# Patient Record
Sex: Female | Born: 1960 | Race: White | Hispanic: No | Marital: Married | Smoking: Never smoker
Health system: Southern US, Community
[De-identification: ages and names within clinical notes are randomized; demographics above are authoritative.]

## PROBLEM LIST (undated history)

## (undated) DIAGNOSIS — K589 Irritable bowel syndrome without diarrhea: Secondary | ICD-10-CM

## (undated) DIAGNOSIS — M069 Rheumatoid arthritis, unspecified: Secondary | ICD-10-CM

## (undated) DIAGNOSIS — R079 Chest pain, unspecified: Secondary | ICD-10-CM

## (undated) DIAGNOSIS — R600 Localized edema: Secondary | ICD-10-CM

## (undated) DIAGNOSIS — E669 Obesity, unspecified: Secondary | ICD-10-CM

## (undated) DIAGNOSIS — M549 Dorsalgia, unspecified: Secondary | ICD-10-CM

## (undated) DIAGNOSIS — N393 Stress incontinence (female) (male): Secondary | ICD-10-CM

## (undated) DIAGNOSIS — E785 Hyperlipidemia, unspecified: Secondary | ICD-10-CM

## (undated) DIAGNOSIS — K829 Disease of gallbladder, unspecified: Secondary | ICD-10-CM

## (undated) DIAGNOSIS — I10 Essential (primary) hypertension: Secondary | ICD-10-CM

## (undated) DIAGNOSIS — E559 Vitamin D deficiency, unspecified: Secondary | ICD-10-CM

## (undated) DIAGNOSIS — R0602 Shortness of breath: Secondary | ICD-10-CM

## (undated) DIAGNOSIS — K649 Unspecified hemorrhoids: Secondary | ICD-10-CM

## (undated) DIAGNOSIS — Z8489 Family history of other specified conditions: Secondary | ICD-10-CM

## (undated) DIAGNOSIS — K219 Gastro-esophageal reflux disease without esophagitis: Secondary | ICD-10-CM

## (undated) DIAGNOSIS — F419 Anxiety disorder, unspecified: Secondary | ICD-10-CM

## (undated) DIAGNOSIS — F329 Major depressive disorder, single episode, unspecified: Secondary | ICD-10-CM

## (undated) DIAGNOSIS — N200 Calculus of kidney: Secondary | ICD-10-CM

## (undated) DIAGNOSIS — F32A Depression, unspecified: Secondary | ICD-10-CM

## (undated) DIAGNOSIS — M2550 Pain in unspecified joint: Secondary | ICD-10-CM

## (undated) HISTORY — PX: CHOLECYSTECTOMY OPEN: SUR202

## (undated) HISTORY — DX: Obesity, unspecified: E66.9

## (undated) HISTORY — DX: Depression, unspecified: F32.A

## (undated) HISTORY — DX: Hyperlipidemia, unspecified: E78.5

## (undated) HISTORY — DX: Irritable bowel syndrome, unspecified: K58.9

## (undated) HISTORY — DX: Chest pain, unspecified: R07.9

## (undated) HISTORY — DX: Localized edema: R60.0

## (undated) HISTORY — DX: Dorsalgia, unspecified: M54.9

## (undated) HISTORY — DX: Vitamin D deficiency, unspecified: E55.9

## (undated) HISTORY — DX: Stress incontinence (female) (male): N39.3

## (undated) HISTORY — DX: Gastro-esophageal reflux disease without esophagitis: K21.9

## (undated) HISTORY — DX: Disease of gallbladder, unspecified: K82.9

## (undated) HISTORY — DX: Pain in unspecified joint: M25.50

## (undated) HISTORY — DX: Shortness of breath: R06.02

## (undated) HISTORY — DX: Unspecified hemorrhoids: K64.9

---
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
## 1998-10-31 ENCOUNTER — Encounter: Payer: Self-pay | Admitting: Emergency Medicine

## 1998-10-31 ENCOUNTER — Emergency Department (HOSPITAL_COMMUNITY): Admission: EM | Admit: 1998-10-31 | Discharge: 1998-10-31 | Payer: Self-pay | Admitting: Emergency Medicine

## 1998-12-21 ENCOUNTER — Encounter: Payer: Self-pay | Admitting: Obstetrics and Gynecology

## 1998-12-21 ENCOUNTER — Ambulatory Visit (HOSPITAL_COMMUNITY): Admission: RE | Admit: 1998-12-21 | Discharge: 1998-12-21 | Payer: Self-pay | Admitting: Obstetrics and Gynecology

## 2000-01-30 ENCOUNTER — Ambulatory Visit (HOSPITAL_COMMUNITY): Admission: RE | Admit: 2000-01-30 | Discharge: 2000-01-30 | Payer: Self-pay | Admitting: Obstetrics and Gynecology

## 2000-01-30 ENCOUNTER — Encounter: Payer: Self-pay | Admitting: Obstetrics and Gynecology

## 2001-01-22 ENCOUNTER — Other Ambulatory Visit: Admission: RE | Admit: 2001-01-22 | Discharge: 2001-01-22 | Payer: Self-pay | Admitting: Obstetrics and Gynecology

## 2001-02-04 ENCOUNTER — Encounter: Payer: Self-pay | Admitting: Obstetrics and Gynecology

## 2001-02-04 ENCOUNTER — Ambulatory Visit (HOSPITAL_COMMUNITY): Admission: RE | Admit: 2001-02-04 | Discharge: 2001-02-04 | Payer: Self-pay | Admitting: Obstetrics and Gynecology

## 2001-02-10 ENCOUNTER — Encounter: Admission: RE | Admit: 2001-02-10 | Discharge: 2001-02-10 | Payer: Self-pay | Admitting: Internal Medicine

## 2001-02-10 ENCOUNTER — Encounter: Payer: Self-pay | Admitting: Internal Medicine

## 2001-07-29 ENCOUNTER — Encounter: Payer: Self-pay | Admitting: Obstetrics and Gynecology

## 2001-07-29 ENCOUNTER — Encounter: Admission: RE | Admit: 2001-07-29 | Discharge: 2001-07-29 | Payer: Self-pay | Admitting: Obstetrics and Gynecology

## 2002-04-06 ENCOUNTER — Other Ambulatory Visit: Admission: RE | Admit: 2002-04-06 | Discharge: 2002-04-06 | Payer: Self-pay | Admitting: Obstetrics and Gynecology

## 2003-05-01 ENCOUNTER — Other Ambulatory Visit: Admission: RE | Admit: 2003-05-01 | Discharge: 2003-05-01 | Payer: Self-pay | Admitting: Obstetrics and Gynecology

## 2003-05-10 ENCOUNTER — Ambulatory Visit (HOSPITAL_COMMUNITY): Admission: RE | Admit: 2003-05-10 | Discharge: 2003-05-10 | Payer: Self-pay | Admitting: Obstetrics and Gynecology

## 2004-10-25 ENCOUNTER — Encounter: Admission: RE | Admit: 2004-10-25 | Discharge: 2004-10-25 | Payer: Self-pay | Admitting: Rheumatology

## 2004-11-05 ENCOUNTER — Ambulatory Visit (HOSPITAL_COMMUNITY): Admission: RE | Admit: 2004-11-05 | Discharge: 2004-11-05 | Payer: Self-pay | Admitting: Obstetrics and Gynecology

## 2005-01-28 ENCOUNTER — Other Ambulatory Visit: Admission: RE | Admit: 2005-01-28 | Discharge: 2005-01-28 | Payer: Self-pay | Admitting: Obstetrics and Gynecology

## 2005-12-02 ENCOUNTER — Ambulatory Visit (HOSPITAL_COMMUNITY): Admission: RE | Admit: 2005-12-02 | Discharge: 2005-12-02 | Payer: Self-pay | Admitting: Obstetrics and Gynecology

## 2006-02-05 ENCOUNTER — Other Ambulatory Visit: Admission: RE | Admit: 2006-02-05 | Discharge: 2006-02-05 | Payer: Self-pay | Admitting: Obstetrics and Gynecology

## 2006-12-23 ENCOUNTER — Ambulatory Visit (HOSPITAL_COMMUNITY): Admission: RE | Admit: 2006-12-23 | Discharge: 2006-12-23 | Payer: Self-pay | Admitting: Obstetrics and Gynecology

## 2008-01-06 ENCOUNTER — Ambulatory Visit (HOSPITAL_COMMUNITY): Admission: RE | Admit: 2008-01-06 | Discharge: 2008-01-06 | Payer: Self-pay | Admitting: Obstetrics and Gynecology

## 2009-01-23 ENCOUNTER — Ambulatory Visit (HOSPITAL_COMMUNITY): Admission: RE | Admit: 2009-01-23 | Discharge: 2009-01-23 | Payer: Self-pay | Admitting: Obstetrics and Gynecology

## 2009-10-30 ENCOUNTER — Encounter: Admission: RE | Admit: 2009-10-30 | Discharge: 2009-10-30 | Payer: Self-pay | Admitting: Internal Medicine

## 2010-01-24 ENCOUNTER — Ambulatory Visit (HOSPITAL_COMMUNITY): Admission: RE | Admit: 2010-01-24 | Discharge: 2010-01-24 | Payer: Self-pay | Admitting: Obstetrics and Gynecology

## 2010-08-01 ENCOUNTER — Other Ambulatory Visit: Payer: Self-pay | Admitting: Internal Medicine

## 2010-08-01 DIAGNOSIS — R635 Abnormal weight gain: Secondary | ICD-10-CM

## 2010-08-01 DIAGNOSIS — R221 Localized swelling, mass and lump, neck: Secondary | ICD-10-CM

## 2010-08-05 ENCOUNTER — Ambulatory Visit
Admission: RE | Admit: 2010-08-05 | Discharge: 2010-08-05 | Disposition: A | Discharge status: Home / Self Care | Payer: Managed Care, Other (non HMO) | Source: Ambulatory Visit | Attending: Internal Medicine | Admitting: Internal Medicine

## 2010-08-05 DIAGNOSIS — R221 Localized swelling, mass and lump, neck: Secondary | ICD-10-CM

## 2010-08-05 DIAGNOSIS — R635 Abnormal weight gain: Secondary | ICD-10-CM

## 2011-02-06 ENCOUNTER — Other Ambulatory Visit (HOSPITAL_COMMUNITY): Payer: Self-pay | Admitting: Obstetrics and Gynecology

## 2011-02-06 DIAGNOSIS — Z1231 Encounter for screening mammogram for malignant neoplasm of breast: Secondary | ICD-10-CM

## 2011-02-14 ENCOUNTER — Other Ambulatory Visit: Payer: Self-pay | Admitting: Internal Medicine

## 2011-02-14 ENCOUNTER — Ambulatory Visit
Admission: RE | Admit: 2011-02-14 | Discharge: 2011-02-14 | Disposition: A | Discharge status: Home / Self Care | Payer: Managed Care, Other (non HMO) | Source: Ambulatory Visit | Attending: Internal Medicine | Admitting: Internal Medicine

## 2011-02-14 DIAGNOSIS — E041 Nontoxic single thyroid nodule: Secondary | ICD-10-CM

## 2011-02-20 ENCOUNTER — Ambulatory Visit (HOSPITAL_COMMUNITY)
Admission: RE | Admit: 2011-02-20 | Discharge: 2011-02-20 | Disposition: A | Discharge status: Home / Self Care | Payer: Managed Care, Other (non HMO) | Source: Ambulatory Visit | Attending: Obstetrics and Gynecology | Admitting: Obstetrics and Gynecology

## 2011-02-20 DIAGNOSIS — Z1231 Encounter for screening mammogram for malignant neoplasm of breast: Secondary | ICD-10-CM

## 2011-04-29 ENCOUNTER — Encounter (INDEPENDENT_AMBULATORY_CARE_PROVIDER_SITE_OTHER): Payer: Self-pay | Admitting: Surgery

## 2011-05-14 ENCOUNTER — Encounter (INDEPENDENT_AMBULATORY_CARE_PROVIDER_SITE_OTHER): Payer: Self-pay | Admitting: Surgery

## 2011-05-15 ENCOUNTER — Ambulatory Visit (INDEPENDENT_AMBULATORY_CARE_PROVIDER_SITE_OTHER): Payer: Self-pay | Admitting: Surgery

## 2011-06-04 ENCOUNTER — Encounter (INDEPENDENT_AMBULATORY_CARE_PROVIDER_SITE_OTHER): Payer: Self-pay | Admitting: Surgery

## 2011-06-04 ENCOUNTER — Ambulatory Visit (INDEPENDENT_AMBULATORY_CARE_PROVIDER_SITE_OTHER): Payer: Managed Care, Other (non HMO) | Admitting: Surgery

## 2011-06-04 VITALS — BP 158/100 | HR 60 | Temp 97.8°F | Resp 18 | Ht 63.0 in | Wt 215.0 lb

## 2011-06-04 DIAGNOSIS — K644 Residual hemorrhoidal skin tags: Secondary | ICD-10-CM | POA: Insufficient documentation

## 2011-06-04 DIAGNOSIS — K648 Other hemorrhoids: Secondary | ICD-10-CM

## 2011-06-04 NOTE — Progress Notes (Signed)
Patient ID: Sandra Henderson, female   DOB: [birth year], 51 y.o.   MRN: [id number]  Chief Complaint  Patient presents with  . Hemorrhoids    HPI Sandra Henderson is a 51 y.o. female.  Referred by Dr. Stefano Gaul for evaluation of hemorrhoids. HPI 51 yo female who developed hemorrhoids during her pregnancy 20 years ago presents with intermittent episodes of painful hemorrhoids and persistent discomfort/ burning.  She has regular daily bowel movements with no constipation.  Colonoscopy last year showed internal hemorrhoids and two small polyps.  She has been using over the counter creams and wet wipes, but continues to have persistent symptoms.  She is now referred to discuss possible surgical intervention. Past Medical History  Diagnosis Date  . Hemorrhoid   . Obesity   . SUI (stress urinary incontinence, female)   . Arthritis   . GERD (gastroesophageal reflux disease)   . Hyperlipidemia     Past Surgical History  Procedure Date  . Cholecystectomy     No family history on file.  Social History History  Substance Use Topics  . Smoking status: Never Smoker   . Smokeless tobacco: Not on file  . Alcohol Use: 4.5 oz/week    9 drink(s) per week    No Known Allergies  Current Outpatient Prescriptions  Medication Sig Dispense Refill  . ALPRAZolam (XANAX) 1 MG tablet Take 2.5 mg by mouth as needed.      Marland Kitchen amLODipine-olmesartan (AZOR) 5-20 MG per tablet Take 1 tablet by mouth daily.      . BuPROPion HCl (WELLBUTRIN PO) Take 100 mg by mouth daily.       . metroNIDAZOLE (METROGEL) 0.75 % gel Apply 1 application topically daily.      Marland Kitchen nystatin cream (MYCOSTATIN) Apply 1 application topically daily.      . Omeprazole (PRILOSEC PO) Take by mouth daily.       . simvastatin (ZOCOR) 20 MG tablet Take 20 mg by mouth daily.        Review of Systems Review of Systems  Constitutional: Negative for fever, chills and unexpected weight change.  HENT: Negative for hearing loss, congestion, sore  throat, trouble swallowing and voice change.   Eyes: Negative for visual disturbance.  Respiratory: Negative for cough and wheezing.   Cardiovascular: Negative for chest pain, palpitations and leg swelling.  Gastrointestinal: Positive for blood in stool and anal bleeding. Negative for nausea, vomiting, abdominal pain, diarrhea, constipation and abdominal distention.  Genitourinary: Negative for hematuria, vaginal bleeding and difficulty urinating.  Musculoskeletal: Positive for arthralgias.  Skin: Negative for rash and wound.  Neurological: Negative for seizures, syncope and headaches.  Hematological: Negative for adenopathy. Does not bruise/bleed easily.  Psychiatric/Behavioral: Negative for confusion.    Blood pressure 158/100, pulse 60, temperature 97.8 F (36.6 C), temperature source Temporal, resp. rate 18, height 5\' 3"  (1.6 m), weight 215 lb (97.523 kg).  Physical Exam Physical Exam WDWN in NAD Rectal - circumferential loose external hemorrhoids with two areas of prolapsing internal mucosa.  No abscess, fissure, or fistula noted.  Moderately enlarged internal hemorrhoids on DRE. Data Reviewed None  Assessment    Enlarged external hemorrhoids with prolapsing internal hemorrhoids    Plan    The patient has obviously failed conservative therapy over the last 20 years.  Recommend hemorrhoidectomy under anesthesia.  The surgical procedure has been discussed with the patient.  Potential risks, benefits, alternative treatments, and expected outcomes have been explained.  All of the patient's questions at this time  have been answered.  The likelihood of reaching the patient's treatment goal is good.  The patient understand the proposed surgical procedure and wishes to proceed.        Azure Barrales K. 06/04/2011, 10:18 AM

## 2011-06-04 NOTE — Patient Instructions (Signed)
We will schedule your surgery today. 

## 2011-06-04 NOTE — Progress Notes (Signed)
Addended by: Wynona Luna on: 06/04/2011 10:26 AM   Modules accepted: Orders

## 2011-06-05 ENCOUNTER — Encounter (INDEPENDENT_AMBULATORY_CARE_PROVIDER_SITE_OTHER): Payer: Self-pay

## 2012-06-23 ENCOUNTER — Other Ambulatory Visit: Payer: Self-pay | Admitting: Obstetrics and Gynecology

## 2012-06-23 DIAGNOSIS — Z1231 Encounter for screening mammogram for malignant neoplasm of breast: Secondary | ICD-10-CM

## 2012-07-15 ENCOUNTER — Ambulatory Visit (HOSPITAL_COMMUNITY): Payer: Managed Care, Other (non HMO)

## 2012-07-27 ENCOUNTER — Ambulatory Visit (HOSPITAL_COMMUNITY)
Admission: RE | Admit: 2012-07-27 | Discharge: 2012-07-27 | Disposition: A | Discharge status: Home / Self Care | Payer: Managed Care, Other (non HMO) | Source: Ambulatory Visit | Attending: Obstetrics and Gynecology | Admitting: Obstetrics and Gynecology

## 2012-07-27 DIAGNOSIS — Z1231 Encounter for screening mammogram for malignant neoplasm of breast: Secondary | ICD-10-CM | POA: Insufficient documentation

## 2013-06-20 ENCOUNTER — Other Ambulatory Visit (HOSPITAL_COMMUNITY): Payer: Self-pay | Admitting: Internal Medicine

## 2013-06-20 DIAGNOSIS — Z1231 Encounter for screening mammogram for malignant neoplasm of breast: Secondary | ICD-10-CM

## 2013-08-02 ENCOUNTER — Ambulatory Visit (HOSPITAL_COMMUNITY)
Admission: RE | Admit: 2013-08-02 | Discharge: 2013-08-02 | Disposition: A | Discharge status: Home / Self Care | Payer: Managed Care, Other (non HMO) | Source: Ambulatory Visit | Attending: Internal Medicine | Admitting: Internal Medicine

## 2013-08-02 DIAGNOSIS — Z1231 Encounter for screening mammogram for malignant neoplasm of breast: Secondary | ICD-10-CM | POA: Insufficient documentation

## 2014-09-04 ENCOUNTER — Other Ambulatory Visit (HOSPITAL_COMMUNITY): Payer: Self-pay | Admitting: Internal Medicine

## 2014-09-04 DIAGNOSIS — Z1231 Encounter for screening mammogram for malignant neoplasm of breast: Secondary | ICD-10-CM

## 2014-09-14 ENCOUNTER — Ambulatory Visit (HOSPITAL_COMMUNITY): Payer: Managed Care, Other (non HMO)

## 2014-09-21 ENCOUNTER — Ambulatory Visit (HOSPITAL_COMMUNITY)
Admission: RE | Admit: 2014-09-21 | Discharge: 2014-09-21 | Disposition: A | Discharge status: Home / Self Care | Payer: BLUE CROSS/BLUE SHIELD | Source: Ambulatory Visit | Attending: Internal Medicine | Admitting: Internal Medicine

## 2014-09-21 DIAGNOSIS — Z1231 Encounter for screening mammogram for malignant neoplasm of breast: Secondary | ICD-10-CM | POA: Insufficient documentation

## 2014-09-26 ENCOUNTER — Emergency Department (HOSPITAL_COMMUNITY): Payer: BLUE CROSS/BLUE SHIELD

## 2014-09-26 ENCOUNTER — Encounter (HOSPITAL_COMMUNITY): Payer: Self-pay | Admitting: *Deleted

## 2014-09-26 ENCOUNTER — Observation Stay (HOSPITAL_COMMUNITY)
Admission: EM | Admit: 2014-09-26 | Discharge: 2014-09-27 | Disposition: A | Discharge status: Home / Self Care | Payer: BLUE CROSS/BLUE SHIELD | Attending: Internal Medicine | Admitting: Internal Medicine

## 2014-09-26 ENCOUNTER — Other Ambulatory Visit (HOSPITAL_BASED_OUTPATIENT_CLINIC_OR_DEPARTMENT_OTHER): Payer: Managed Care, Other (non HMO)

## 2014-09-26 DIAGNOSIS — M1990 Unspecified osteoarthritis, unspecified site: Secondary | ICD-10-CM | POA: Diagnosis not present

## 2014-09-26 DIAGNOSIS — R209 Unspecified disturbances of skin sensation: Secondary | ICD-10-CM | POA: Diagnosis present

## 2014-09-26 DIAGNOSIS — F1020 Alcohol dependence, uncomplicated: Secondary | ICD-10-CM

## 2014-09-26 DIAGNOSIS — M069 Rheumatoid arthritis, unspecified: Secondary | ICD-10-CM | POA: Diagnosis not present

## 2014-09-26 DIAGNOSIS — E785 Hyperlipidemia, unspecified: Secondary | ICD-10-CM | POA: Insufficient documentation

## 2014-09-26 DIAGNOSIS — I739 Peripheral vascular disease, unspecified: Secondary | ICD-10-CM | POA: Insufficient documentation

## 2014-09-26 DIAGNOSIS — Z6836 Body mass index (BMI) 36.0-36.9, adult: Secondary | ICD-10-CM | POA: Insufficient documentation

## 2014-09-26 DIAGNOSIS — F419 Anxiety disorder, unspecified: Secondary | ICD-10-CM | POA: Diagnosis not present

## 2014-09-26 DIAGNOSIS — Z9049 Acquired absence of other specified parts of digestive tract: Secondary | ICD-10-CM | POA: Diagnosis not present

## 2014-09-26 DIAGNOSIS — R208 Other disturbances of skin sensation: Secondary | ICD-10-CM | POA: Diagnosis not present

## 2014-09-26 DIAGNOSIS — E669 Obesity, unspecified: Secondary | ICD-10-CM | POA: Diagnosis not present

## 2014-09-26 DIAGNOSIS — K219 Gastro-esophageal reflux disease without esophagitis: Secondary | ICD-10-CM | POA: Diagnosis not present

## 2014-09-26 DIAGNOSIS — I10 Essential (primary) hypertension: Secondary | ICD-10-CM

## 2014-09-26 DIAGNOSIS — I639 Cerebral infarction, unspecified: Secondary | ICD-10-CM

## 2014-09-26 DIAGNOSIS — G459 Transient cerebral ischemic attack, unspecified: Secondary | ICD-10-CM

## 2014-09-26 DIAGNOSIS — F10239 Alcohol dependence with withdrawal, unspecified: Secondary | ICD-10-CM | POA: Insufficient documentation

## 2014-09-26 HISTORY — DX: Calculus of kidney: N20.0

## 2014-09-26 HISTORY — DX: Rheumatoid arthritis, unspecified: M06.9

## 2014-09-26 HISTORY — DX: Anxiety disorder, unspecified: F41.9

## 2014-09-26 HISTORY — DX: Family history of other specified conditions: Z84.89

## 2014-09-26 HISTORY — DX: Essential (primary) hypertension: I10

## 2014-09-26 LAB — CBC WITH DIFFERENTIAL/PLATELET
Basophils Absolute: 0.1 10*3/uL (ref 0.0–0.1)
Basophils Relative: 1 % (ref 0–1)
Eosinophils Absolute: 0.1 10*3/uL (ref 0.0–0.7)
Eosinophils Relative: 2 % (ref 0–5)
HCT: 40.7 % (ref 36.0–46.0)
Hemoglobin: 13.5 g/dL (ref 12.0–15.0)
Lymphocytes Relative: 34 % (ref 12–46)
Lymphs Abs: 2.8 10*3/uL (ref 0.7–4.0)
MCH: 28.7 pg (ref 26.0–34.0)
MCHC: 33.2 g/dL (ref 30.0–36.0)
MCV: 86.4 fL (ref 78.0–100.0)
Monocytes Absolute: 0.5 10*3/uL (ref 0.1–1.0)
Monocytes Relative: 6 % (ref 3–12)
Neutro Abs: 4.8 10*3/uL (ref 1.7–7.7)
Neutrophils Relative %: 57 % (ref 43–77)
Platelets: 443 10*3/uL — ABNORMAL HIGH (ref 150–400)
RBC: 4.71 MIL/uL (ref 3.87–5.11)
RDW: 12.9 % (ref 11.5–15.5)
WBC: 8.2 10*3/uL (ref 4.0–10.5)

## 2014-09-26 LAB — URINALYSIS, ROUTINE W REFLEX MICROSCOPIC
Bilirubin Urine: NEGATIVE
Glucose, UA: NEGATIVE mg/dL
Hgb urine dipstick: NEGATIVE
Ketones, ur: NEGATIVE mg/dL
Leukocytes, UA: NEGATIVE
Nitrite: NEGATIVE
Protein, ur: NEGATIVE mg/dL
Specific Gravity, Urine: 1.02 (ref 1.005–1.030)
Urobilinogen, UA: 0.2 mg/dL (ref 0.0–1.0)
pH: 6.5 (ref 5.0–8.0)

## 2014-09-26 LAB — I-STAT TROPONIN, ED: Troponin i, poc: 0 ng/mL (ref 0.00–0.08)

## 2014-09-26 LAB — COMPREHENSIVE METABOLIC PANEL
ALT: 30 U/L (ref 14–54)
AST: 26 U/L (ref 15–41)
Albumin: 4 g/dL (ref 3.5–5.0)
Alkaline Phosphatase: 62 U/L (ref 38–126)
Anion gap: 10 (ref 5–15)
BUN: 13 mg/dL (ref 6–20)
CO2: 23 mmol/L (ref 22–32)
Calcium: 9.6 mg/dL (ref 8.9–10.3)
Chloride: 105 mmol/L (ref 101–111)
Creatinine, Ser: 0.76 mg/dL (ref 0.44–1.00)
GFR calc Af Amer: >60 mL/min (ref >60)
GFR calc non Af Amer: >60 mL/min (ref >60)
Glucose, Bld: 114 mg/dL — ABNORMAL HIGH (ref 65–99)
Potassium: 3.9 mmol/L (ref 3.5–5.1)
Sodium: 138 mmol/L (ref 135–145)
Total Bilirubin: 0.7 mg/dL (ref 0.3–1.2)
Total Protein: 7.2 g/dL (ref 6.5–8.1)

## 2014-09-26 LAB — PROTIME-INR
INR: 1.04 (ref 0.00–1.49)
Prothrombin Time: 13.7 seconds (ref 11.6–15.2)

## 2014-09-26 LAB — RAPID URINE DRUG SCREEN, HOSP PERFORMED
Amphetamines: NOT DETECTED
Barbiturates: NOT DETECTED
Benzodiazepines: POSITIVE — AB
Cocaine: NOT DETECTED
Opiates: NOT DETECTED
Tetrahydrocannabinol: NOT DETECTED

## 2014-09-26 LAB — APTT: aPTT: 26 seconds (ref 24–37)

## 2014-09-26 MED ORDER — STROKE: EARLY STAGES OF RECOVERY BOOK: Freq: Once | Status: DC

## 2014-09-26 MED ORDER — DIPHENHYDRAMINE HCL 50 MG/ML IJ SOLN
25.0000 mg | Freq: Once | INTRAMUSCULAR | Status: AC
  Administered 2014-09-26: 25 mg via INTRAVENOUS
  Filled 2014-09-26: qty 1

## 2014-09-26 MED ORDER — IRBESARTAN 300 MG PO TABS
300.0000 mg | ORAL_TABLET | Freq: Every day | ORAL | Status: DC
  Administered 2014-09-27: 300 mg via ORAL
  Filled 2014-09-26 (×2): qty 1

## 2014-09-26 MED ORDER — ACETAMINOPHEN 650 MG RE SUPP: 650.0000 mg | RECTAL | Status: DC | PRN

## 2014-09-26 MED ORDER — LORAZEPAM 2 MG/ML IJ SOLN: 1.0000 mg | Freq: Four times a day (QID) | INTRAMUSCULAR | Status: DC | PRN

## 2014-09-26 MED ORDER — LORAZEPAM 2 MG/ML IJ SOLN
2.0000 mg | Freq: Once | INTRAMUSCULAR | Status: AC
  Administered 2014-09-27: 2 mg via INTRAVENOUS
  Filled 2014-09-26: qty 1

## 2014-09-26 MED ORDER — ONDANSETRON HCL 4 MG/2ML IJ SOLN: 4.0000 mg | Freq: Four times a day (QID) | INTRAMUSCULAR | Status: DC | PRN

## 2014-09-26 MED ORDER — LORAZEPAM 1 MG PO TABS: 1.0000 mg | ORAL_TABLET | Freq: Four times a day (QID) | ORAL | Status: DC | PRN

## 2014-09-26 MED ORDER — SODIUM CHLORIDE 0.9 % IV BOLUS (SEPSIS)
1000.0000 mL | INTRAVENOUS | Status: AC
  Administered 2014-09-26: 1000 mL via INTRAVENOUS

## 2014-09-26 MED ORDER — AMLODIPINE-OLMESARTAN 5-20 MG PO TABS: 1.0000 | ORAL_TABLET | Freq: Every day | ORAL | Status: DC

## 2014-09-26 MED ORDER — VITAMIN B-1 100 MG PO TABS
100.0000 mg | ORAL_TABLET | Freq: Every day | ORAL | Status: DC
  Administered 2014-09-26 – 2014-09-27 (×2): 100 mg via ORAL
  Filled 2014-09-26 (×2): qty 1

## 2014-09-26 MED ORDER — THIAMINE HCL 100 MG/ML IJ SOLN: 100.0000 mg | Freq: Every day | INTRAMUSCULAR | Status: DC

## 2014-09-26 MED ORDER — PANTOPRAZOLE SODIUM 40 MG PO TBEC: 40.0000 mg | DELAYED_RELEASE_TABLET | Freq: Every day | ORAL | Status: DC

## 2014-09-26 MED ORDER — PANTOPRAZOLE SODIUM 40 MG PO TBEC
40.0000 mg | DELAYED_RELEASE_TABLET | Freq: Every day | ORAL | Status: DC
  Administered 2014-09-27: 40 mg via ORAL
  Filled 2014-09-26: qty 1

## 2014-09-26 MED ORDER — POLYVINYL ALCOHOL 1.4 % OP SOLN
1.0000 [drp] | OPHTHALMIC | Status: DC | PRN
  Administered 2014-09-27: 1 [drp] via OPHTHALMIC
  Filled 2014-09-26: qty 15

## 2014-09-26 MED ORDER — FOLIC ACID 1 MG PO TABS
1.0000 mg | ORAL_TABLET | Freq: Every day | ORAL | Status: DC
  Administered 2014-09-26 – 2014-09-27 (×2): 1 mg via ORAL
  Filled 2014-09-26 (×2): qty 1

## 2014-09-26 MED ORDER — METOCLOPRAMIDE HCL 5 MG/ML IJ SOLN
10.0000 mg | Freq: Once | INTRAMUSCULAR | Status: AC
  Administered 2014-09-26: 10 mg via INTRAVENOUS
  Filled 2014-09-26: qty 2

## 2014-09-26 MED ORDER — ALPRAZOLAM 0.5 MG PO TABS: 0.5000 mg | ORAL_TABLET | Freq: Three times a day (TID) | ORAL | Status: DC | PRN

## 2014-09-26 MED ORDER — ADULT MULTIVITAMIN W/MINERALS CH
1.0000 | ORAL_TABLET | Freq: Every day | ORAL | Status: DC
  Administered 2014-09-26 – 2014-09-27 (×2): 1 via ORAL
  Filled 2014-09-26 (×2): qty 1

## 2014-09-26 MED ORDER — HYDROCHLOROTHIAZIDE 12.5 MG PO CAPS
12.5000 mg | ORAL_CAPSULE | Freq: Two times a day (BID) | ORAL | Status: DC
  Administered 2014-09-27: 12.5 mg via ORAL
  Filled 2014-09-26 (×2): qty 1

## 2014-09-26 MED ORDER — ACETAMINOPHEN 325 MG PO TABS: 650.0000 mg | ORAL_TABLET | ORAL | Status: DC | PRN

## 2014-09-26 MED ORDER — AMLODIPINE BESYLATE 5 MG PO TABS
5.0000 mg | ORAL_TABLET | Freq: Every day | ORAL | Status: DC
  Administered 2014-09-27: 5 mg via ORAL
  Filled 2014-09-26 (×2): qty 1

## 2014-09-26 MED ORDER — ENOXAPARIN SODIUM 40 MG/0.4ML ~~LOC~~ SOLN
40.0000 mg | SUBCUTANEOUS | Status: DC
  Administered 2014-09-26: 40 mg via SUBCUTANEOUS
  Filled 2014-09-26: qty 0.4

## 2014-09-26 MED ORDER — SENNOSIDES-DOCUSATE SODIUM 8.6-50 MG PO TABS: 1.0000 | ORAL_TABLET | Freq: Every evening | ORAL | Status: DC | PRN

## 2014-09-26 MED ORDER — SIMVASTATIN 20 MG PO TABS
20.0000 mg | ORAL_TABLET | Freq: Every day | ORAL | Status: DC
  Administered 2014-09-26: 20 mg via ORAL
  Filled 2014-09-26: qty 1

## 2014-09-26 NOTE — H&P (Signed)
History and Physical  Sandra Henderson YQM:[id number] DOB: 02/28/1961 DOA: 09/26/2014   PCP: Georgann Housekeeper, MD  Referring Physician: ED/ Dr. Romeo Apple  Chief Complaint: Sensory disturbance  HPI:  54 year old female with a history of GERD, hypertension, hyperlipidemia, anxiety presents with numbness and tingling in her left perioral area as well as her left upper extremity. She stated that her symptoms began on 09/25/2014 around noon time. Her symptoms initially had some improvement, but her symptoms lingered. When she woke up this morning she continued to have the perioral numbness as well as a feeling of her left upper extremity feeling "thick". As a result she came to the emergency department for further evaluation. She denied any dysarthria, visual disturbance, focal extremity weakness.  Patient denies fevers, chills, headache, chest pain, dyspnea, nausea, vomiting, diarrhea, abdominal pain, dysuria, hematuria She states that she has been under a lot of stress recently. Apparently she took her blood pressure at home and it was systolic 160-170 with diastolics 100-110. She states that she drinks 2 glasses of wine every other day but denies any other liquor or beer. She occasionally drinks a mixed drink with her wine.  In emergency department, the patient was afebrile and hemodynamically stable. BMP, hepatic enzymes, CBC were unremarkable. Urinalysis was negative for pyuria. CT of the brain showed a small hypodensity in the left external capsule area indeterminate for possible lacunar infarct. As result, admission was requested for stroke workup. Chest x-ray was negative. Assessment/Plan: Sensory disturbance with abnormal CT brain -MRI brain -Echocardiogram -Carotid duplex -Lipid panel -Hemoglobin A1c -PT/OT Hypertension -Continue amlodipine and ARB combination -Continue HCTZ Anxiety -Continue alprazolam when necessary She is no longer taking Wellbutrin- Alcohol dependence  -Alcohol  withdrawal protocol  GERD -Continue PPI Hyperlipidemia -Continue Zocor     Past Medical History  Diagnosis Date  . Hemorrhoid   . Obesity   . SUI (stress urinary incontinence, female)   . Arthritis   . GERD (gastroesophageal reflux disease)   . Hyperlipidemia    Past Surgical History  Procedure Laterality Date  . Cholecystectomy     Social History:  reports that she has never smoked. She does not have any smokeless tobacco history on file. She reports that she drinks about 4.5 oz of alcohol per week. She reports that she does not use illicit drugs.   No family history on file.   No Known Allergies    Prior to Admission medications   Medication Sig Start Date End Date Taking? Authorizing Provider  ALPRAZolam Prudy Feeler) 1 MG tablet Take 0.5 mg by mouth as needed for anxiety.    Yes Historical Provider, MD  amLODipine-olmesartan (AZOR) 5-20 MG per tablet Take 1 tablet by mouth daily.   Yes Historical Provider, MD  hydrochlorothiazide (MICROZIDE) 12.5 MG capsule Take 12.5 mg by mouth daily.   Yes Historical Provider, MD  metroNIDAZOLE (METROGEL) 0.75 % gel Apply 1 application topically daily.   Yes Historical Provider, MD  nystatin cream (MYCOSTATIN) Apply 1 application topically daily.   Yes Historical Provider, MD  omeprazole (PRILOSEC) 20 MG capsule Take 20 mg by mouth daily.   Yes Historical Provider, MD  BuPROPion HCl (WELLBUTRIN PO) Take 100 mg by mouth daily.     Historical Provider, MD  Omeprazole (PRILOSEC PO) Take by mouth daily.     Historical Provider, MD  simvastatin (ZOCOR) 20 MG tablet Take 20 mg by mouth daily.    Historical Provider, MD    Review of Systems:  Constitutional:  No weight loss, night sweats, Fevers, chills, fatigue.  Head&Eyes: No headache.  No vision loss.  No eye pain or scotoma ENT:  No Difficulty swallowing,Tooth/dental problems,Sore throat,  No ear ache, post nasal drip,  Cardio-vascular:  No chest pain, Orthopnea, PND, swelling in  lower extremities,  dizziness, palpitations  GI:  No  abdominal pain, nausea, vomiting, diarrhea, loss of appetite, hematochezia, melena, heartburn, indigestion, Resp:  No shortness of breath with exertion or at rest. No cough. No coughing up of blood .No wheezing.No chest wall deformity  Skin:  no rash or lesions.  GU:  no dysuria, change in color of urine, no urgency or frequency. No flank pain.  Musculoskeletal:  No joint pain or swelling. No decreased range of motion. No back pain.  Psych:  No change in mood or affect. No depression or anxiety. Neurologic: No headache, no dysesthesia, no focal weakness, no vision loss. No syncope  Physical Exam: Filed Vitals:   09/26/14 1513 09/26/14 1518 09/26/14 1553 09/26/14 1800  BP: 127/85  129/76 125/71  Pulse: 85  77 80  Temp: 98 F (36.7 C) 98 F (36.7 C) 97.8 F (36.6 C) 97.9 F (36.6 C)  TempSrc: Oral  Oral Oral  Resp: 18  16 15   Height:      Weight:      SpO2: 99%  99% 100%   General:  A&O x 3, NAD, nontoxic, pleasant/cooperative Head/Eye: No conjunctival hemorrhage, no icterus, [place]/AT, No nystagmus ENT:  No icterus,  No thrush, good dentition, no pharyngeal exudate Neck:  No masses, no lymphadenpathy, no bruits CV:  RRR, no rub, no gallop, no S3 Lung:  CTAB, good air movement, no wheeze, no rhonchi Abdomen: soft/NT, +BS, nondistended, no peritoneal signs Ext: No cyanosis, No rashes, No petechiae, No lymphangitis, No edema Neuro: CNII-XII intact, strength 4/5 in bilateral upper and lower extremities, no dysmetria  Labs on Admission:  Basic Metabolic Panel:  Recent Labs Lab 09/26/14 1123  NA 138  K 3.9  CL 105  CO2 23  GLUCOSE 114*  BUN 13  CREATININE 0.76  CALCIUM 9.6   Liver Function Tests:  Recent Labs Lab 09/26/14 1123  AST 26  ALT 30  ALKPHOS 62  BILITOT 0.7  PROT 7.2  ALBUMIN 4.0   No results for input(s): LIPASE, AMYLASE in the last 168 hours. No results for input(s): AMMONIA in the last 168  hours. CBC:  Recent Labs Lab 09/26/14 1123  WBC 8.2  NEUTROABS 4.8  HGB 13.5  HCT 40.7  MCV 86.4  PLT 443*   Cardiac Enzymes: No results for input(s): CKTOTAL, CKMB, CKMBINDEX, TROPONINI in the last 168 hours. BNP: Invalid input(s): POCBNP CBG: No results for input(s): GLUCAP in the last 168 hours.  Radiological Exams on Admission: Dg Chest 2 View  09/26/2014   CLINICAL DATA:  54 year old female with a history of hypertension.  EXAM: CHEST - 2 VIEW  COMPARISON:  None.  FINDINGS: Cardiomediastinal silhouette projects within normal limits in size and contour. No confluent airspace disease, pneumothorax, or pleural effusion.  No displaced fracture.  Unremarkable appearance of the upper abdomen.  IMPRESSION: No radiographic evidence of acute cardiopulmonary disease.  Signed,  51. Yvone Neu, DO  [facility]  [facility]   Electronically Signed   By: [facility] D.O.   On: 09/26/2014 11:37   Ct Head Wo Contrast  09/26/2014   CLINICAL DATA:  Frontal headache.  Left arm heaviness.  EXAM: CT HEAD WITHOUT CONTRAST  TECHNIQUE: Contiguous axial images were obtained from the base of the skull through the vertex without intravenous contrast.  COMPARISON:  None.  FINDINGS: There is no evidence of mass effect, midline shift, or extra-axial fluid collections. There is no evidence of a space-occupying lesion or intracranial hemorrhage. There is no evidence of a cortical-based area of acute infarction. Small hypodensity in the region of the left external capsule which may reflect a small perivascular space versus lacunar infarct of indeterminate age. There is mild periventricular white matter low attenuation likely secondary to microangiopathy.  The ventricles and sulci are appropriate for the patient's age. The basal cisterns are patent.  Visualized portions of the orbits are unremarkable. The visualized portions of the paranasal sinuses and mastoid air cells  are unremarkable.  The osseous structures are unremarkable.  IMPRESSION: 1. Small hypodensity in the region of the left external capsule which may reflect a small perivascular space versus lacunar infarct of indeterminate age. 2. Otherwise no acute intracranial pathology.   Electronically Signed   By: Elige Ko   On: 09/26/2014 11:59      Time spent:60 minutes Code Status:   FULL Family Communication: No  Family at bedside   Ripken Rekowski, DO  [facility] Pager [phone]  If 7PM-7AM, please contact night-coverage [email or web address] Password [facility] 09/26/2014, 6:59 PM

## 2014-09-26 NOTE — ED Notes (Signed)
Pt in from home c/o hypertension with similar symptoms onset yesterday, pt c/o HA & indigestion, pt states, "My left arm feels thick & my lips feel like they are tingling." pt A&O x4, follows commands, speaks in complete sentences, ambulatory upon arrival to ED, no slurred speech present, no facial droop present

## 2014-09-26 NOTE — Progress Notes (Addendum)
   09/26/14 1553  Vitals  Temp 97.8 F (36.6 C)  Temp Source Oral  BP 129/76 mmHg  BP Location Left Arm  BP Method Automatic  Patient Position (if appropriate) Lying  Pulse Rate 77  Pulse Rate Source Dinamap  Resp 16  Oxygen Therapy  SpO2 99 %  Pain Assessment  Pain Assessment 0-10  Pain Score 1  Pain Type Acute pain  Pain Location Head  Pain Orientation Other (Comment) (frontal headache)  Pain Intervention(s) RN made aware  Pt arrived to 4N04 at 1550.  Pt placed on telemetry and CCMD notified.  Pt A&O x 4, c/o 2/10 frontal headachePt V/S taken,see above. Pt without distress. Diet ordered, will monitor.

## 2014-09-26 NOTE — ED Provider Notes (Signed)
CSN: [id number]     Arrival date & time 09/26/14  1025 History   First MD Initiated Contact with Patient 09/26/14 1028     Chief Complaint  Patient presents with  . Hypertension  . Headache     (Consider location/radiation/quality/duration/timing/severity/associated sxs/prior Treatment) Patient is a 54 y.o. female presenting with hypertension, headaches, and neurologic complaint. The history is provided by the patient.  Hypertension Associated symptoms include headaches. Pertinent negatives include no chest pain, no abdominal pain and no shortness of breath.  Headache Associated symptoms: numbness   Associated symptoms: no abdominal pain, no back pain, no congestion, no cough, no diarrhea, no dizziness, no eye pain, no fatigue, no fever, no nausea, no neck pain and no vomiting   Neurologic Problem This is a recurrent problem. The current episode started 12 to 24 hours ago. The problem occurs constantly. The problem has been gradually improving. Associated symptoms include headaches. Pertinent negatives include no chest pain, no abdominal pain and no shortness of breath. Nothing aggravates the symptoms. Nothing relieves the symptoms. She has tried nothing for the symptoms. The treatment provided no relief.    Past Medical History  Diagnosis Date  . Hemorrhoid   . Obesity   . SUI (stress urinary incontinence, female)   . Arthritis   . GERD (gastroesophageal reflux disease)   . Hyperlipidemia    Past Surgical History  Procedure Laterality Date  . Cholecystectomy     No family history on file. History  Substance Use Topics  . Smoking status: Never Smoker   . Smokeless tobacco: Not on file  . Alcohol Use: 4.5 oz/week    9 drink(s) per week   OB History    No data available     Review of Systems  Constitutional: Negative for fever and fatigue.  HENT: Negative for congestion and drooling.   Eyes: Negative for pain.  Respiratory: Negative for cough and shortness of breath.    Cardiovascular: Negative for chest pain.  Gastrointestinal: Negative for nausea, vomiting, abdominal pain and diarrhea.  Genitourinary: Negative for dysuria and hematuria.  Musculoskeletal: Negative for back pain, gait problem and neck pain.  Skin: Negative for color change.  Neurological: Positive for numbness and headaches. Negative for dizziness.  Hematological: Negative for adenopathy.  Psychiatric/Behavioral: Negative for behavioral problems.  All other systems reviewed and are negative.     Allergies  Review of patient's allergies indicates no known allergies.  Home Medications   Prior to Admission medications   Medication Sig Start Date End Date Taking? Authorizing Provider  ALPRAZolam Prudy Feeler) 1 MG tablet Take 0.5 mg by mouth as needed for anxiety.    Yes Historical Provider, MD  amLODipine-olmesartan (AZOR) 5-20 MG per tablet Take 1 tablet by mouth daily.   Yes Historical Provider, MD  hydrochlorothiazide (MICROZIDE) 12.5 MG capsule Take 12.5 mg by mouth daily.   Yes Historical Provider, MD  metroNIDAZOLE (METROGEL) 0.75 % gel Apply 1 application topically daily.   Yes Historical Provider, MD  nystatin cream (MYCOSTATIN) Apply 1 application topically daily.   Yes Historical Provider, MD  omeprazole (PRILOSEC) 20 MG capsule Take 20 mg by mouth daily.   Yes Historical Provider, MD  BuPROPion HCl (WELLBUTRIN PO) Take 100 mg by mouth daily.     Historical Provider, MD  Omeprazole (PRILOSEC PO) Take by mouth daily.     Historical Provider, MD  simvastatin (ZOCOR) 20 MG tablet Take 20 mg by mouth daily.    Historical Provider, MD   BP 152/99  mmHg  Pulse 86  Temp(Src) 98.2 F (36.8 C) (Oral)  Ht 5\' 3"  (1.6 m)  Wt 205 lb (92.987 kg)  BMI 36.32 kg/m2  SpO2 99%  LMP 09/10/2014 (Approximate) Physical Exam  Constitutional: She is oriented to person, place, and time. She appears well-developed and well-nourished.  HENT:  Head: Normocephalic and atraumatic.  Mouth/Throat:  Oropharynx is clear and moist. No oropharyngeal exudate.  Eyes: Conjunctivae and EOM are normal. Pupils are equal, round, and reactive to light.  Neck: Normal range of motion. Neck supple.  Cardiovascular: Normal rate, regular rhythm, normal heart sounds and intact distal pulses.  Exam reveals no gallop and no friction rub.   No murmur heard. Pulmonary/Chest: Effort normal and breath sounds normal. No respiratory distress. She has no wheezes.  Abdominal: Soft. Bowel sounds are normal. There is no tenderness. There is no rebound and no guarding.  Musculoskeletal: Normal range of motion. She exhibits no edema or tenderness.  Neurological: She is alert and oriented to person, place, and time.  alert, oriented x3 speech: normal in context and clarity memory: intact grossly cranial nerves II-XII: altered sensation as noted below, otherwise intact motor strength: full proximally and distally no involuntary movements or tremors Sensation: altered  Sensation to light touch in the V1 distribution on the right side of the face in the V3 distribution on the left side of the face. Otherwise sensation to light touch is intact. cerebellar: finger-to-nose and heel-to-shin intact gait: normal forwards and backwards  Skin: Skin is warm and dry.  Psychiatric: She has a normal mood and affect. Her behavior is normal.  Nursing note and vitals reviewed.   ED Course  Procedures (including critical care time) Labs Review Labs Reviewed  URINE RAPID DRUG SCREEN (HOSP PERFORMED) - Abnormal; Notable for the following:    Benzodiazepines POSITIVE (*)    All other components within normal limits  COMPREHENSIVE METABOLIC PANEL - Abnormal; Notable for the following:    Glucose, Bld 114 (*)    All other components within normal limits  CBC WITH DIFFERENTIAL/PLATELET - Abnormal; Notable for the following:    Platelets 443 (*)    All other components within normal limits  LIPID PANEL - Abnormal; Notable for the  following:    Triglycerides 318 (*)    HDL 34 (*)    VLDL 64 (*)    All other components within normal limits  PROTIME-INR  APTT  URINALYSIS, ROUTINE W REFLEX MICROSCOPIC  BASIC METABOLIC PANEL  HEMOGLOBIN A1C  I-STAT TROPOININ, ED    Imaging Review Dg Chest 2 View  09/26/2014   CLINICAL DATA:  54 year old female with a history of hypertension.  EXAM: CHEST - 2 VIEW  COMPARISON:  None.  FINDINGS: Cardiomediastinal silhouette projects within normal limits in size and contour. No confluent airspace disease, pneumothorax, or pleural effusion.  No displaced fracture.  Unremarkable appearance of the upper abdomen.  IMPRESSION: No radiographic evidence of acute cardiopulmonary disease.  Signed,  53. Yvone Neu, DO  [facility]  [facility]   Electronically Signed   By: [facility] D.O.   On: 09/26/2014 11:37   Ct Head Wo Contrast  09/26/2014   CLINICAL DATA:  Frontal headache.  Left arm heaviness.  EXAM: CT HEAD WITHOUT CONTRAST  TECHNIQUE: Contiguous axial images were obtained from the base of the skull through the vertex without intravenous contrast.  COMPARISON:  None.  FINDINGS: There is no evidence of mass effect, midline shift, or extra-axial fluid collections.  There is no evidence of a space-occupying lesion or intracranial hemorrhage. There is no evidence of a cortical-based area of acute infarction. Small hypodensity in the region of the left external capsule which may reflect a small perivascular space versus lacunar infarct of indeterminate age. There is mild periventricular white matter low attenuation likely secondary to microangiopathy.  The ventricles and sulci are appropriate for the patient's age. The basal cisterns are patent.  Visualized portions of the orbits are unremarkable. The visualized portions of the paranasal sinuses and mastoid air cells are unremarkable.  The osseous structures are unremarkable.  IMPRESSION: 1. Small  hypodensity in the region of the left external capsule which may reflect a small perivascular space versus lacunar infarct of indeterminate age. 2. Otherwise no acute intracranial pathology.   Electronically Signed   By: Elige Ko   On: 09/26/2014 11:59     EKG Interpretation None      MDM   Final diagnoses:  Transient cerebral ischemia, unspecified transient cerebral ischemia type    11:18 AM 54 y.o. female w hx of obesity, HLP, HTN  Who presents with neurologic complaints. She states that she developed a gradual onset tingling in the corner of her left mouth and tingling/heaviness in her left arm yesterday afternoon. She also developed a gradual onset frontal headache. Maximum intensity was 6 out of 10, currently is 4 out of 10. Headache consistent with previous headaches. This morning around 9 AM she developed some tightness in her left chest and back which resolved spontaneously about 45 minutes later. She is afebrile and vital signs are unremarkable here. She denies any head injuries or fevers. She does note that she has had similar neurologic symptoms in the past year with occasional tingling in the left arm or numbness on her face. We'll get screening labs and imaging.  HA improved. Suspicious CT. Will admit for TIA workup.   Purvis Sheffield, MD 09/27/14 [phone]

## 2014-09-27 ENCOUNTER — Observation Stay (HOSPITAL_BASED_OUTPATIENT_CLINIC_OR_DEPARTMENT_OTHER): Payer: BLUE CROSS/BLUE SHIELD

## 2014-09-27 ENCOUNTER — Observation Stay (HOSPITAL_COMMUNITY): Payer: BLUE CROSS/BLUE SHIELD

## 2014-09-27 DIAGNOSIS — G464 Cerebellar stroke syndrome: Secondary | ICD-10-CM | POA: Diagnosis not present

## 2014-09-27 DIAGNOSIS — I6789 Other cerebrovascular disease: Secondary | ICD-10-CM

## 2014-09-27 DIAGNOSIS — E785 Hyperlipidemia, unspecified: Secondary | ICD-10-CM | POA: Diagnosis not present

## 2014-09-27 DIAGNOSIS — R208 Other disturbances of skin sensation: Secondary | ICD-10-CM | POA: Diagnosis not present

## 2014-09-27 DIAGNOSIS — I10 Essential (primary) hypertension: Secondary | ICD-10-CM | POA: Diagnosis not present

## 2014-09-27 LAB — LIPID PANEL
Cholesterol: 188 mg/dL (ref 0–200)
HDL: 34 mg/dL — ABNORMAL LOW (ref >40)
LDL Cholesterol: 90 mg/dL (ref 0–99)
Total CHOL/HDL Ratio: 5.5 RATIO
Triglycerides: 318 mg/dL — ABNORMAL HIGH (ref <150)
VLDL: 64 mg/dL — ABNORMAL HIGH (ref 0–40)

## 2014-09-27 LAB — BASIC METABOLIC PANEL
Anion gap: 7 (ref 5–15)
BUN: 9 mg/dL (ref 6–20)
CO2: 26 mmol/L (ref 22–32)
Calcium: 8.9 mg/dL (ref 8.9–10.3)
Chloride: 105 mmol/L (ref 101–111)
Creatinine, Ser: 0.74 mg/dL (ref 0.44–1.00)
GFR calc Af Amer: >60 mL/min (ref >60)
GFR calc non Af Amer: >60 mL/min (ref >60)
Glucose, Bld: 98 mg/dL (ref 65–99)
Potassium: 3.8 mmol/L (ref 3.5–5.1)
Sodium: 138 mmol/L (ref 135–145)

## 2014-09-27 NOTE — Progress Notes (Signed)
Carotid Duplex Completed. Preliminary results by tech - Mild plaque visualized with no evidence of a stenosis in bilateral carotid arteries.  Marilynne Halsted, BS, RDMS, RVT

## 2014-09-27 NOTE — Evaluation (Signed)
Physical Therapy Evaluation Patient Details Name: Sandra Henderson MRN: [id number] DOB: May 17, 1960 Today's Date: 09/27/2014   History of Present Illness  pt rpesents with L UE and oral decreased sensation.    Clinical Impression  Pt appears to be near baseline with only deficit being decreased sensation on L side of mouth.  Pt ed on signs/symptoms of CVA.  No further PT needs at this time.  Will sign off.      Follow Up Recommendations No PT follow up;Supervision - Intermittent    Equipment Recommendations  None recommended by PT    Recommendations for Other Services       Precautions / Restrictions Precautions Precautions: None Restrictions Weight Bearing Restrictions: No      Mobility  Bed Mobility Overal bed mobility: Independent                Transfers Overall transfer level: Independent Equipment used: None                Ambulation/Gait Ambulation/Gait assistance: Independent Ambulation Distance (Feet): 500 Feet Assistive device: None Gait Pattern/deviations: WFL(Within Functional Limits)        Stairs Stairs: Yes Stairs assistance: Independent Stair Management: No rails;Alternating pattern;Forwards Number of Stairs: 11    Wheelchair Mobility    Modified Rankin (Stroke Patients Only) Modified Rankin (Stroke Patients Only) Pre-Morbid Rankin Score: No symptoms Modified Rankin: No significant disability     Balance Overall balance assessment: Independent                               Standardized Balance Assessment Standardized Balance Assessment : Dynamic Gait Index   Dynamic Gait Index Level Surface: Normal Change in Gait Speed: Normal Gait with Horizontal Head Turns: Normal Gait with Vertical Head Turns: Normal Gait and Pivot Turn: Normal Step Over Obstacle: Normal Step Around Obstacles: Normal Steps: Normal Total Score: 24       Pertinent Vitals/Pain Pain Assessment: No/denies pain    Home Living  Family/patient expects to be discharged to:: Private residence Living Arrangements: Spouse/significant other;Children Available Help at Discharge: Family;Available PRN/intermittently Type of Home: House Home Access: Stairs to enter Entrance Stairs-Rails: None Entrance Stairs-Number of Steps: 2 Home Layout: Multi-level Home Equipment: None      Prior Function Level of Independence: Independent         Comments: 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     Hand Dominance   Dominant Hand: Right    Extremity/Trunk Assessment   Upper Extremity Assessment: Overall WFL for tasks assessed           Lower Extremity Assessment: Overall WFL for tasks assessed      Cervical / Trunk Assessment: Normal  Communication   Communication: No difficulties (pt indicates word-finding deficits occasionally.  )  Cognition Arousal/Alertness: Awake/alert Behavior During Therapy: WFL for tasks assessed/performed Overall Cognitive Status: Within Functional Limits for tasks assessed                      General Comments      Exercises        Assessment/Plan    PT Assessment Patent does not need any further PT services  PT Diagnosis Difficulty walking   PT Problem List    PT Treatment Interventions     PT Goals (Current goals can be found in the Care Plan section) Acute Rehab PT Goals Patient Stated Goal: Home today. PT Goal  Formulation: All assessment and education complete, DC therapy    Frequency     Barriers to discharge        Co-evaluation               End of Session   Activity Tolerance: Patient tolerated treatment well Patient left: in bed;with call bell/phone within reach Nurse Communication: Mobility status    Functional Assessment Tool Used: Clinical Judgement Functional Limitation: Mobility: Walking and moving around Mobility: Walking and Moving Around Current Status (W8889): 0 percent impaired, limited or restricted Mobility: Walking  and Moving Around Goal Status [phone]): 0 percent impaired, limited or restricted Mobility: Walking and Moving Around Discharge Status [phone]): 0 percent impaired, limited or restricted    Time: 2800-3491 PT Time Calculation (min) (ACUTE ONLY): 12 min   Charges:   PT Evaluation $Initial PT Evaluation Tier I: 1 Procedure     PT G Codes:   PT G-Codes **NOT FOR INPATIENT CLASS** Functional Assessment Tool Used: Clinical Judgement Functional Limitation: Mobility: Walking and moving around Mobility: Walking and Moving Around Current Status (P9150): 0 percent impaired, limited or restricted Mobility: Walking and Moving Around Goal Status (V6979): 0 percent impaired, limited or restricted Mobility: Walking and Moving Around Discharge Status (Y8016): 0 percent impaired, limited or restricted    Sunny Schlein, [place] [phone] 09/27/2014, 9:12 AM

## 2014-09-27 NOTE — Progress Notes (Addendum)
PROGRESS NOTE  Sandra Henderson ZOX:[id number] DOB: 09/05/1960 DOA: 09/26/2014 PCP: Georgann Housekeeper, MD  Assessment/Plan: Sensory disturbance with abnormal CT brain -MRI brain -Echocardiogram -Carotid duplex -LDL 94 -Hemoglobin A1c -PT/OT  Hypertension -Continue amlodipine and ARB combination -Continue HCTZ  Anxiety -Continue alprazolam when necessary She is no longer taking Wellbutrin  Alcohol dependence  -Alcohol withdrawal protocol   GERD -Continue PPI  Hyperlipidemia -Continue Zocor  Code Status: *full Family Communication: patient Disposition Plan: home today/tomm once work up complete   Consultants:    Procedures:    HPI/Subjective: Numbness in left arm resolved, now only with some numbness of left lip  Objective: Filed Vitals:   09/27/14 0931  BP: 112/63  Pulse: 78  Temp: 99 F (37.2 C)  Resp: 18   No intake or output data in the 24 hours ending 09/27/14 1004 Filed Weights   09/26/14 1040  Weight: 92.987 kg (205 lb)    Exam:   General:  A+Ox3, NAD  Cardiovascular: rrr  Respiratory: clear  Abdomen: +BS, soft  Musculoskeletal: no edema   Data Reviewed: Basic Metabolic Panel:  Recent Labs Lab 09/26/14 1123 09/27/14 0539  NA 138 138  K 3.9 3.8  CL 105 105  CO2 23 26  GLUCOSE 114* 98  BUN 13 9  CREATININE 0.76 0.74  CALCIUM 9.6 8.9   Liver Function Tests:  Recent Labs Lab 09/26/14 1123  AST 26  ALT 30  ALKPHOS 62  BILITOT 0.7  PROT 7.2  ALBUMIN 4.0   No results for input(s): LIPASE, AMYLASE in the last 168 hours. No results for input(s): AMMONIA in the last 168 hours. CBC:  Recent Labs Lab 09/26/14 1123  WBC 8.2  NEUTROABS 4.8  HGB 13.5  HCT 40.7  MCV 86.4  PLT 443*   Cardiac Enzymes: No results for input(s): CKTOTAL, CKMB, CKMBINDEX, TROPONINI in the last 168 hours. BNP (last 3 results) No results for input(s): BNP in the last 8760 hours.  ProBNP (last 3 results) No results for input(s): PROBNP  in the last 8760 hours.  CBG: No results for input(s): GLUCAP in the last 168 hours.  No results found for this or any previous visit (from the past 240 hour(s)).   Studies: Dg Chest 2 View  09/26/2014   CLINICAL DATA:  54 year old female with a history of hypertension.  EXAM: CHEST - 2 VIEW  COMPARISON:  None.  FINDINGS: Cardiomediastinal silhouette projects within normal limits in size and contour. No confluent airspace disease, pneumothorax, or pleural effusion.  No displaced fracture.  Unremarkable appearance of the upper abdomen.  IMPRESSION: No radiographic evidence of acute cardiopulmonary disease.  Signed,  Yvone Neu. Loreta Ave, DO  [facility]  [facility]   Electronically Signed   By: Gilmer Mor D.O.   On: 09/26/2014 11:37   Ct Head Wo Contrast  09/26/2014   CLINICAL DATA:  Frontal headache.  Left arm heaviness.  EXAM: CT HEAD WITHOUT CONTRAST  TECHNIQUE: Contiguous axial images were obtained from the base of the skull through the vertex without intravenous contrast.  COMPARISON:  None.  FINDINGS: There is no evidence of mass effect, midline shift, or extra-axial fluid collections. There is no evidence of a space-occupying lesion or intracranial hemorrhage. There is no evidence of a cortical-based area of acute infarction. Small hypodensity in the region of the left external capsule which may reflect a small perivascular space versus lacunar infarct of indeterminate age. There is mild periventricular white matter low attenuation likely secondary  to microangiopathy.  The ventricles and sulci are appropriate for the patient's age. The basal cisterns are patent.  Visualized portions of the orbits are unremarkable. The visualized portions of the paranasal sinuses and mastoid air cells are unremarkable.  The osseous structures are unremarkable.  IMPRESSION: 1. Small hypodensity in the region of the left external capsule which may reflect a small  perivascular space versus lacunar infarct of indeterminate age. 2. Otherwise no acute intracranial pathology.   Electronically Signed   By: Elige Ko   On: 09/26/2014 11:59    Scheduled Meds: .  stroke: mapping our early stages of recovery book   Does not apply Once  . amLODipine  5 mg Oral Daily   And  . irbesartan  300 mg Oral Daily  . enoxaparin (LOVENOX) injection  40 mg Subcutaneous Q24H  . folic acid  1 mg Oral Daily  . hydrochlorothiazide  12.5 mg Oral BID  . LORazepam  2 mg Intravenous Once  . multivitamin with minerals  1 tablet Oral Daily  . pantoprazole  40 mg Oral Daily  . simvastatin  20 mg Oral q1800  . thiamine  100 mg Oral Daily   Continuous Infusions:  Antibiotics Given (last 72 hours)    None      Active Problems:   Sensory disturbance   Essential hypertension   Alcohol dependence   Hyperlipidemia    Time spent: 25 min    Ephrem Carrick  [facility] Pager [phone]. If 7PM-7AM, please contact night-coverage at [email or web address], password [facility] 09/27/2014, 10:04 AM

## 2014-09-27 NOTE — Progress Notes (Signed)
Patient is discharged from room 4N04 at this time. Alert and instable condition. IV site d/c'd as well as tele. Instructions read to patient and understanding verbalized. Left unit via wheelchair with all belongings at side.

## 2014-09-27 NOTE — Progress Notes (Signed)
OT Cancellation Note  Patient Details Name: HASNA STEFANIK MRN: [id number] DOB: 1960-09-11   Cancelled Treatment:    Reason Eval/Treat Not Completed: Patient at procedure or test/ unavailable. Pt at MRI, acute OT will follow up as available to complete evaluation.   Nena Jordan M   Carney Living, OTR/L Occupational Therapist [phone] (pager)  09/27/2014, 4:30 PM

## 2014-09-27 NOTE — Discharge Summary (Signed)
Physician Discharge Summary  Sandra Henderson ZDG:[id number] DOB: Dec 10, 1960 DOA: 09/26/2014  PCP: Georgann Housekeeper, MD  Admit date: 09/26/2014 Discharge date: 09/27/2014  Time spent: 35 minutes  Recommendations for Outpatient Follow-up:  1. HgbA1C results pending at d/c- 5.7 (resulted after d/c)  Discharge Diagnoses:  Active Problems:   Sensory disturbance   Essential hypertension   Alcohol dependence   Hyperlipidemia   Discharge Condition: improved  Diet recommendation: low Na  Filed Weights   09/26/14 1040  Weight: 92.987 kg (205 lb)    History of present illness:  54 year old female with a history of GERD, hypertension, hyperlipidemia, anxiety presents with numbness and tingling in her left perioral area as well as her left upper extremity. She stated that her symptoms began on 09/25/2014 around noon time. Her symptoms initially had some improvement, but her symptoms lingered. When she woke up this morning she continued to have the perioral numbness as well as a feeling of her left upper extremity feeling "thick". As a result she came to the emergency department for further evaluation. She denied any dysarthria, visual disturbance, focal extremity weakness. Patient denies fevers, chills, headache, chest pain, dyspnea, nausea, vomiting, diarrhea, abdominal pain, dysuria, hematuria She states that she has been under a lot of stress recently. Apparently she took her blood pressure at home and it was systolic 160-170 with diastolics 100-110. She states that she drinks 2 glasses of wine every other day but denies any other liquor or beer. She occasionally drinks a mixed drink with her wine.  In emergency department, the patient was afebrile and hemodynamically stable. BMP, hepatic enzymes, CBC were unremarkable. Urinalysis was negative for pyuria. CT of the brain showed a small hypodensity in the left external capsule area indeterminate for possible lacunar infarct. As result, admission was  requested for stroke workup. Chest x-ray was negative  Hospital Course:  Sensory disturbance with abnormal CT brain -MRI brain negative -Echocardiogram -Carotid duplex Mild plaque visualized with no evidence of a stenosis in bilateral carotid arteries.  -LDL 94 -Hemoglobin A1c 5.7 -PT/OT  Hypertension- BP controlled here- suspect symptoms related to elevated BP/stress -Continue amlodipine and ARB combination -Continue HCTZ  Anxiety -Continue alprazolam when necessary She is no longer taking Wellbutrin  Alcohol dependence  -Alcohol withdrawal protocol   GERD -Continue PPI  Hyperlipidemia -Continue Zocor  Procedures:  echo  Consultations:    Discharge Exam: Filed Vitals:   09/27/14 1715  BP: 124/83  Pulse: 76  Temp: 98.1 F (36.7 C)  Resp: 18     Discharge Instructions   Discharge Instructions    Diet - low sodium heart healthy    Complete by:  As directed      Discharge instructions    Complete by:  As directed      Increase activity slowly    Complete by:  As directed           Current Discharge Medication List    CONTINUE these medications which have NOT CHANGED   Details  ALPRAZolam (XANAX) 1 MG tablet Take 0.5 mg by mouth as needed for anxiety.     amLODipine-olmesartan (AZOR) 5-20 MG per tablet Take 1 tablet by mouth daily.    hydrochlorothiazide (MICROZIDE) 12.5 MG capsule Take 12.5 mg by mouth daily.    metroNIDAZOLE (METROGEL) 0.75 % gel Apply 1 application topically daily.    nystatin cream (MYCOSTATIN) Apply 1 application topically daily.    omeprazole (PRILOSEC) 20 MG capsule Take 20 mg by mouth daily.  simvastatin (ZOCOR) 20 MG tablet Take 20 mg by mouth daily.      STOP taking these medications     BuPROPion HCl (WELLBUTRIN PO)        No Known Allergies Follow-up Information    Follow up with HUSAIN,KARRAR, MD In 1 week.   Specialty:  Internal Medicine   Contact information:   [address] [place] [address] [phone]        The results of significant diagnostics from this hospitalization (including imaging, microbiology, ancillary and laboratory) are listed below for reference.    Significant Diagnostic Studies: Dg Chest 2 View  09/26/2014   CLINICAL DATA:  54 year old female with a history of hypertension.  EXAM: CHEST - 2 VIEW  COMPARISON:  None.  FINDINGS: Cardiomediastinal silhouette projects within normal limits in size and contour. No confluent airspace disease, pneumothorax, or pleural effusion.  No displaced fracture.  Unremarkable appearance of the upper abdomen.  IMPRESSION: No radiographic evidence of acute cardiopulmonary disease.  Signed,  Yvone Neu. Loreta Ave, DO  [facility]  [facility]   Electronically Signed   By: Gilmer Mor D.O.   On: 09/26/2014 11:37   Ct Head Wo Contrast  09/26/2014   CLINICAL DATA:  Frontal headache.  Left arm heaviness.  EXAM: CT HEAD WITHOUT CONTRAST  TECHNIQUE: Contiguous axial images were obtained from the base of the skull through the vertex without intravenous contrast.  COMPARISON:  None.  FINDINGS: There is no evidence of mass effect, midline shift, or extra-axial fluid collections. There is no evidence of a space-occupying lesion or intracranial hemorrhage. There is no evidence of a cortical-based area of acute infarction. Small hypodensity in the region of the left external capsule which may reflect a small perivascular space versus lacunar infarct of indeterminate age. There is mild periventricular white matter low attenuation likely secondary to microangiopathy.  The ventricles and sulci are appropriate for the patient's age. The basal cisterns are patent.  Visualized portions of the orbits are unremarkable. The visualized portions of the paranasal sinuses and mastoid air cells are unremarkable.  The osseous structures are unremarkable.  IMPRESSION: 1. Small hypodensity in the region  of the left external capsule which may reflect a small perivascular space versus lacunar infarct of indeterminate age. 2. Otherwise no acute intracranial pathology.   Electronically Signed   By: Elige Ko   On: 09/26/2014 11:59   Mr Brain Wo Contrast  09/27/2014   CLINICAL DATA:  53 year old female with numbness and tingling about the left face and upper extremity. Symptoms for 2 days. Initial encounter.  EXAM: MRI HEAD WITHOUT CONTRAST  MRA HEAD WITHOUT CONTRAST  TECHNIQUE: Multiplanar, multiecho pulse sequences of the brain and surrounding structures were obtained without intravenous contrast. Angiographic images of the head were obtained using MRA technique without contrast.  COMPARISON:  Head CT without contrast 09/26/2014.  FINDINGS: MRI HEAD FINDINGS  Cerebral volume is within normal limits for age. Mild susceptibility artifact over the left superior frontal gyrus on diffusion-weighted imaging, appears related to dense dural calcification. Series 5, image 43. No restricted diffusion to suggest acute infarction. No midline shift, mass effect, evidence of mass lesion, ventriculomegaly, extra-axial collection or acute intracranial hemorrhage. Cervicomedullary junction and pituitary are within normal limits. Major intracranial vascular flow voids are within normal limits.  Small dilated perivascular space at the left external capsule corresponding to the recent CT finding. Overall gray and white matter signal is normal for age. No  cortical encephalomalacia or chronic blood products identified.  Visible internal auditory structures appear normal. Mastoids are clear. Paranasal sinuses are clear. Unremarkable noncontrast appearance of the cavernous sinus. Orbits soft tissues appear normal. Visualized scalp soft tissues are within normal limits. Normal bone marrow signal. Negative visualized cervical spine.  MRA HEAD FINDINGS  Antegrade flow in the posterior circulation. Mildly dominant distal left vertebral  artery. Normal right PICA origin. Patent vertebrobasilar junction with mild tortuosity. Dominant and duplicated appearing left AICA. No basilar artery stenosis. SCA and left PCA origins are normal. Fetal type right PCA origin. Diminutive or absent left posterior communicating artery. Bilateral PCA branches are within normal limits.  Tortuous cervical ICAs, more so the right. Antegrade flow in both ICA siphons. Mild siphon irregularity in keeping with atherosclerosis. No siphon stenosis. Ophthalmic and right posterior communicating artery origins are normal. Patent carotid termini. MCA and ACA origins are normal.  Anterior communicating artery and visualized bilateral ACA branches are within normal limits. Visualized bilateral MCA branches are within normal limits.  IMPRESSION: 1. No acute intracranial abnormality. Normal for age noncontrast MRI appearance of the brain. 2. Tortuous carotid and vertebral arteries. Otherwise negative intracranial MRA.   Electronically Signed   By: Odessa Fleming M.D.   On: 09/27/2014 17:12   Mr Maxine Glenn Head/brain Wo Cm  09/27/2014   CLINICAL DATA:  54 year old female with numbness and tingling about the left face and upper extremity. Symptoms for 2 days. Initial encounter.  EXAM: MRI HEAD WITHOUT CONTRAST  MRA HEAD WITHOUT CONTRAST  TECHNIQUE: Multiplanar, multiecho pulse sequences of the brain and surrounding structures were obtained without intravenous contrast. Angiographic images of the head were obtained using MRA technique without contrast.  COMPARISON:  Head CT without contrast 09/26/2014.  FINDINGS: MRI HEAD FINDINGS  Cerebral volume is within normal limits for age. Mild susceptibility artifact over the left superior frontal gyrus on diffusion-weighted imaging, appears related to dense dural calcification. Series 5, image 43. No restricted diffusion to suggest acute infarction. No midline shift, mass effect, evidence of mass lesion, ventriculomegaly, extra-axial collection or acute  intracranial hemorrhage. Cervicomedullary junction and pituitary are within normal limits. Major intracranial vascular flow voids are within normal limits.  Small dilated perivascular space at the left external capsule corresponding to the recent CT finding. Overall gray and white matter signal is normal for age. No cortical encephalomalacia or chronic blood products identified.  Visible internal auditory structures appear normal. Mastoids are clear. Paranasal sinuses are clear. Unremarkable noncontrast appearance of the cavernous sinus. Orbits soft tissues appear normal. Visualized scalp soft tissues are within normal limits. Normal bone marrow signal. Negative visualized cervical spine.  MRA HEAD FINDINGS  Antegrade flow in the posterior circulation. Mildly dominant distal left vertebral artery. Normal right PICA origin. Patent vertebrobasilar junction with mild tortuosity. Dominant and duplicated appearing left AICA. No basilar artery stenosis. SCA and left PCA origins are normal. Fetal type right PCA origin. Diminutive or absent left posterior communicating artery. Bilateral PCA branches are within normal limits.  Tortuous cervical ICAs, more so the right. Antegrade flow in both ICA siphons. Mild siphon irregularity in keeping with atherosclerosis. No siphon stenosis. Ophthalmic and right posterior communicating artery origins are normal. Patent carotid termini. MCA and ACA origins are normal.  Anterior communicating artery and visualized bilateral ACA branches are within normal limits. Visualized bilateral MCA branches are within normal limits.  IMPRESSION: 1. No acute intracranial abnormality. Normal for age noncontrast MRI appearance of the brain. 2. Tortuous carotid and vertebral arteries.  Otherwise negative intracranial MRA.   Electronically Signed   By: Odessa Fleming M.D.   On: 09/27/2014 17:12    Microbiology: No results found for this or any previous visit (from the past 240 hour(s)).   Labs: Basic  Metabolic Panel:  Recent Labs Lab 09/26/14 1123 09/27/14 0539  NA 138 138  K 3.9 3.8  CL 105 105  CO2 23 26  GLUCOSE 114* 98  BUN 13 9  CREATININE 0.76 0.74  CALCIUM 9.6 8.9   Liver Function Tests:  Recent Labs Lab 09/26/14 1123  AST 26  ALT 30  ALKPHOS 62  BILITOT 0.7  PROT 7.2  ALBUMIN 4.0   No results for input(s): LIPASE, AMYLASE in the last 168 hours. No results for input(s): AMMONIA in the last 168 hours. CBC:  Recent Labs Lab 09/26/14 1123  WBC 8.2  NEUTROABS 4.8  HGB 13.5  HCT 40.7  MCV 86.4  PLT 443*   Cardiac Enzymes: No results for input(s): CKTOTAL, CKMB, CKMBINDEX, TROPONINI in the last 168 hours. BNP: BNP (last 3 results) No results for input(s): BNP in the last 8760 hours.  ProBNP (last 3 results) No results for input(s): PROBNP in the last 8760 hours.  CBG: No results for input(s): GLUCAP in the last 168 hours.     SignedMarlin Canary  [facility] 09/27/2014, 5:40 PM

## 2014-09-27 NOTE — Evaluation (Signed)
Speech Language Pathology Evaluation Patient Details Name: Sandra Henderson MRN: [id number] DOB: Dec 31, 1960 Today's Date: 09/27/2014 Time: 1200-1220 SLP Time Calculation (min) (ACUTE ONLY): 20 min  Problem List:  Patient Active Problem List   Diagnosis Date Noted  . Sensory disturbance 09/26/2014  . Essential hypertension 09/26/2014  . Alcohol dependence 09/26/2014  . Hyperlipidemia 09/26/2014  . Hemorrhoids, external 06/04/2011  . Prolapsed internal hemorrhoids 06/04/2011   Past Medical History:  Past Medical History  Diagnosis Date  . Hemorrhoid   . Obesity   . SUI (stress urinary incontinence, female)   . GERD (gastroesophageal reflux disease)   . Kidney stones   . Family history of adverse reaction to anesthesia     "Mom's blood pressure bottoms out"   . Hypertension   . Hyperlipidemia   . Rheumatoid arthritis   . Anxiety    Past Surgical History:  Past Surgical History  Procedure Laterality Date  . Tonsillectomy  1960's  . Cholecystectomy open  ~ 19886   HPI:  54 year old female admitted 09/26/14 due to left side numbness   Assessment / Plan / Recommendation Clinical Impression  Pt scored 30/30 on MMSE, indicating cognitive linguistic performance within normal limits. Minimal left labial numbness reported, however, ROM and strength WFL, and speech is fully intelligible    SLP Assessment  Patient does not need any further Speech Lanaguage Pathology Services    Follow Up Recommendations  None    Frequency and Duration  n/a      Pertinent Vitals/Pain Pain Assessment: 0-10 Pain Score: 1  Pain Location: headache   SLP Goals  Progression toward goals: Goals met, education completed, patient discharged from SLP Patient/Family Stated Goal: to go home today  SLP Evaluation Prior Functioning  Cognitive/Linguistic Baseline: Within functional limits Type of Home: House  Lives With: Spouse Available Help at Discharge: Family;Available  PRN/intermittently Vocation: Full time employment   Cognition  Overall Cognitive Status: Within Functional Limits for tasks assessed Arousal/Alertness: Awake/alert Orientation Level: Oriented X4 Attention: Selective Selective Attention: Appears intact Memory: Appears intact Awareness: Appears intact Problem Solving: Appears intact Safety/Judgment: Appears intact    Comprehension  Auditory Comprehension Overall Auditory Comprehension: Appears within functional limits for tasks assessed Reading Comprehension Reading Status: Within funtional limits    Expression Expression Primary Mode of Expression: Verbal Verbal Expression Overall Verbal Expression: Appears within functional limits for tasks assessed Written Expression Dominant Hand: Right Written Expression: Within Functional Limits   Oral / Motor Oral Motor/Sensory Function Overall Oral Motor/Sensory Function: Appears within functional limits for tasks assessed Motor Speech Overall Motor Speech: Appears within functional limits for tasks assessed   GO Functional Assessment Tool Used: ASHA NOMS, MMSE Functional Limitations: Memory Memory Current Status (K1224): 0 percent impaired, limited or restricted Memory Goal Status (S9753): 0 percent impaired, limited or restricted Memory Discharge Status (Y0511): 0 percent impaired, limited or restricted   Celia B. Quentin Ore [facility], CCC-SLP [phone]  Shonna Chock 09/27/2014, 12:25 PM

## 2014-09-28 LAB — HEMOGLOBIN A1C
Hgb A1c MFr Bld: 5.7 % — ABNORMAL HIGH (ref 4.8–5.6)
Mean Plasma Glucose: 117 mg/dL

## 2015-05-23 ENCOUNTER — Ambulatory Visit (INDEPENDENT_AMBULATORY_CARE_PROVIDER_SITE_OTHER): Payer: Worker's Compensation | Admitting: Emergency Medicine

## 2015-05-23 VITALS — BP 114/76 | HR 93 | Temp 98.6°F | Resp 16 | Ht 63.5 in | Wt 205.0 lb

## 2015-05-23 DIAGNOSIS — S82002A Unspecified fracture of left patella, initial encounter for closed fracture: Secondary | ICD-10-CM | POA: Diagnosis not present

## 2015-05-23 NOTE — Patient Instructions (Signed)
Patellar Fracture, Adult °A patellar fracture is a break in your kneecap (patella).  °CAUSES  °· A direct blow to the knee or a fall is usually the cause of a broken patella. °· A very hard and strong bending of your knee can cause a patellar fracture. °RISK FACTORS °Involvement in contact sports, especially sports that involve a lot of jumping. °SIGNS AND SYMPTOMS  °· Tender and swollen knee. °· Pain when you move your knee, especially when you try to straighten out your leg. °· Difficulty walking or putting weight on your knee. °· Misshapen knee (as if a bone is out of place). °DIAGNOSIS  °Patellar fracture is usually diagnosed with a physical exam and an X-ray exam. °TREATMENT  °Treatment depends on the type of fracture: °· If your patella is still in the right position after the fracture and you can still straighten your leg out, you can usually be treated with a splint or cast for 4-6 weeks. °· If your patella is broken into multiple small pieces but you are able to straighten your leg, you can usually be treated with a splint or cast for 4-6 weeks. Sometimes your patella may need to be removed before the cast is applied. °· If you cannot straighten out your leg after a patellar fracture, then surgery is required to hold the bony fragments together until they heal. A cast or splint will be applied for 4-6 weeks. °HOME CARE INSTRUCTIONS  °· Only take over-the-counter or prescription medicines for pain, discomfort, or fever as directed by your health care provider. °· Use crutches as directed, and exercise the leg as directed. °· Apply ice to the injured area: °¨ Put ice in a plastic bag. °¨ Place a towel between your skin and the bag. °¨ Leave the ice on for 20 minutes, 2-3 times a day. °· Elevate the affected knee above the level of your heart. °SEEK MEDICAL CARE IF: °· You suspect you have significantly injured your knee. °· You hear a pop after a knee injury. °· Your knee is misshapen after a knee  injury. °· You have pain when you move your knee. °· You have difficulty walking or putting weight on your knee. °· You cannot fully move your knee. °SEEK IMMEDIATE MEDICAL CARE IF: °· You have redness, swelling, or increasing pain in your knee. °· You have a fever. °  °This information is not intended to replace advice given to you by your health care provider. Make sure you discuss any questions you have with your health care provider. °  °Document Released: 01/25/2003 Document Revised: 02/16/2013 Document Reviewed: 12/08/2012 °Elsevier Interactive Patient Education ©2016 Elsevier Inc. ° °

## 2015-05-23 NOTE — Progress Notes (Signed)
Subjective:  Patient ID: Sandra Henderson, female    DOB: 09/08/1960  Age: 55 y.o. MRN: [id number]  CC: Knee Injury   HPI Sandra Henderson presents  with an injury to her left knee. She was walking on an uneven surface in [place] on Thursday and lost her footing and landed on her knee. She went to see her orthopedic surgeon on Friday and had a radiograph done and that was significant for a fracture of her patella. She was treated with a knee brace and discharge. Now she's been advised to come here by her employer's HR department to have it evaluated again. Radiographs of her knee or not available to review. She does have medical records from the orthopedic surgeon's office however and the review  History Sandra Henderson has a past medical history of Hemorrhoid; Obesity; SUI (stress urinary incontinence, female); GERD (gastroesophageal reflux disease); Kidney stones; Family history of adverse reaction to anesthesia; Hypertension; Hyperlipidemia; Rheumatoid arthritis (HCC); and Anxiety.   She has past surgical history that includes Tonsillectomy (1960's) and Cholecystectomy open (~ 1988).   Her  family history is not on file.  She   reports that she has never smoked. She has never used smokeless tobacco. She reports that she drinks about 13.2 oz of alcohol per week. She reports that she does not use illicit drugs.  Outpatient Prescriptions Prior to Visit  Medication Sig Dispense Refill  . ALPRAZolam (XANAX) 1 MG tablet Take 0.5 mg by mouth as needed for anxiety.     Marland Kitchen amLODipine-olmesartan (AZOR) 5-20 MG per tablet Take 1 tablet by mouth daily.    . hydrochlorothiazide (MICROZIDE) 12.5 MG capsule Take 12.5 mg by mouth daily.    . metroNIDAZOLE (METROGEL) 0.75 % gel Apply 1 application topically daily.    Marland Kitchen nystatin cream (MYCOSTATIN) Apply 1 application topically daily.    Marland Kitchen omeprazole (PRILOSEC) 20 MG capsule Take 20 mg by mouth daily.    . simvastatin (ZOCOR) 20 MG tablet Take 20 mg by mouth  daily.     No facility-administered medications prior to visit.    Social History   Social History  . Marital Status: Married    Spouse Name: N/A  . Number of Children: N/A  . Years of Education: N/A   Social History Main Topics  . Smoking status: Never Smoker   . Smokeless tobacco: Never Used  . Alcohol Use: 13.2 oz/week    9 Standard drinks or equivalent, 12 Glasses of wine, 1 Shots of liquor per week  . Drug Use: No  . Sexual Activity: Yes   Other Topics Concern  . None   Social History Narrative     Review of Systems  Constitutional: Negative for fever, chills and appetite change.  HENT: Negative for congestion, ear pain, postnasal drip, sinus pressure and sore throat.   Eyes: Negative for pain and redness.  Respiratory: Negative for cough, shortness of breath and wheezing.   Cardiovascular: Negative for leg swelling.  Gastrointestinal: Negative for nausea, vomiting, abdominal pain, diarrhea, constipation and blood in stool.  Endocrine: Negative for polyuria.  Genitourinary: Negative for dysuria, urgency, frequency and flank pain.  Musculoskeletal: Negative for gait problem.  Skin: Negative for rash.  Neurological: Negative for weakness and headaches.  Psychiatric/Behavioral: Negative for confusion and decreased concentration. The patient is not nervous/anxious.     Objective:  BP 114/76 mmHg  Pulse 93  Temp(Src) 98.6 F (37 C)  Resp 16  Ht 5' 3.5" (1.613 m)  Wt 205  lb (92.987 kg)  BMI 35.74 kg/m2  SpO2 98%  Physical Exam  Constitutional: She is oriented to person, place, and time. She appears well-developed and well-nourished. No distress.  HENT:  Head: Normocephalic and atraumatic.  Right Ear: External ear normal.  Left Ear: External ear normal.  Nose: Nose normal.  Eyes: Conjunctivae and EOM are normal. Pupils are equal, round, and reactive to light. No scleral icterus.  Neck: Normal range of motion. Neck supple. No tracheal deviation present.    Cardiovascular: Normal rate, regular rhythm and normal heart sounds.   Pulmonary/Chest: Effort normal. No respiratory distress. She has no wheezes. She has no rales.  Abdominal: She exhibits no mass. There is no tenderness. There is no rebound and no guarding.  Musculoskeletal: She exhibits no edema.       Left knee: She exhibits decreased range of motion, swelling, effusion and ecchymosis. She exhibits no deformity. Tenderness found.  Lymphadenopathy:    She has no cervical adenopathy.  Neurological: She is alert and oriented to person, place, and time. Coordination normal.  Skin: Skin is warm and dry. No rash noted.  Psychiatric: She has a normal mood and affect. Her behavior is normal.      Assessment & Plan:   Sandra Henderson was seen today for knee injury.  Diagnoses and all orders for this visit:  Left patella fracture, closed, initial encounter -     Ambulatory referral to Orthopedic Surgery   I am having [name] maintain her amLODipine-olmesartan, simvastatin, ALPRAZolam, nystatin cream, metroNIDAZOLE, hydrochlorothiazide, and omeprazole.  No orders of the defined types were placed in this encounter.    Appropriate red flag conditions were discussed with the patient as well as actions that should be taken.  Patient expressed his understanding.  Follow-up: Return in about 1 week (around 05/30/2015).  Carmelina Dane, MD

## 2015-05-30 ENCOUNTER — Other Ambulatory Visit: Payer: Self-pay | Admitting: Gastroenterology

## 2015-06-08 ENCOUNTER — Encounter (HOSPITAL_COMMUNITY): Payer: Self-pay | Admitting: *Deleted

## 2015-06-19 ENCOUNTER — Ambulatory Visit (HOSPITAL_COMMUNITY): Payer: BLUE CROSS/BLUE SHIELD | Admitting: Anesthesiology

## 2015-06-19 ENCOUNTER — Ambulatory Visit (HOSPITAL_COMMUNITY)
Admission: RE | Admit: 2015-06-19 | Discharge: 2015-06-19 | Disposition: A | Discharge status: Home / Self Care | Payer: BLUE CROSS/BLUE SHIELD | Source: Ambulatory Visit | Attending: Gastroenterology | Admitting: Gastroenterology

## 2015-06-19 ENCOUNTER — Encounter (HOSPITAL_COMMUNITY)
Admission: RE | Disposition: A | Discharge status: Home / Self Care | Payer: Self-pay | Source: Ambulatory Visit | Attending: Gastroenterology

## 2015-06-19 ENCOUNTER — Encounter (HOSPITAL_COMMUNITY): Payer: Self-pay | Admitting: Anesthesiology

## 2015-06-19 DIAGNOSIS — K319 Disease of stomach and duodenum, unspecified: Secondary | ICD-10-CM | POA: Insufficient documentation

## 2015-06-19 DIAGNOSIS — Z86010 Personal history of colonic polyps: Secondary | ICD-10-CM | POA: Insufficient documentation

## 2015-06-19 DIAGNOSIS — K5730 Diverticulosis of large intestine without perforation or abscess without bleeding: Secondary | ICD-10-CM | POA: Insufficient documentation

## 2015-06-19 DIAGNOSIS — Z800 Family history of malignant neoplasm of digestive organs: Secondary | ICD-10-CM | POA: Insufficient documentation

## 2015-06-19 DIAGNOSIS — K219 Gastro-esophageal reflux disease without esophagitis: Secondary | ICD-10-CM | POA: Diagnosis not present

## 2015-06-19 DIAGNOSIS — K648 Other hemorrhoids: Secondary | ICD-10-CM | POA: Diagnosis not present

## 2015-06-19 DIAGNOSIS — K921 Melena: Secondary | ICD-10-CM | POA: Diagnosis present

## 2015-06-19 DIAGNOSIS — M1990 Unspecified osteoarthritis, unspecified site: Secondary | ICD-10-CM | POA: Insufficient documentation

## 2015-06-19 DIAGNOSIS — I10 Essential (primary) hypertension: Secondary | ICD-10-CM | POA: Diagnosis not present

## 2015-06-19 HISTORY — PX: ESOPHAGOGASTRODUODENOSCOPY (EGD) WITH PROPOFOL: SHX5813

## 2015-06-19 HISTORY — PX: COLONOSCOPY WITH PROPOFOL: SHX5780

## 2015-06-19 SURGERY — COLONOSCOPY WITH PROPOFOL
Anesthesia: Monitor Anesthesia Care

## 2015-06-19 MED ORDER — SODIUM CHLORIDE 0.9 % IV SOLN: INTRAVENOUS | Status: DC

## 2015-06-19 MED ORDER — PROPOFOL 500 MG/50ML IV EMUL
INTRAVENOUS | Status: DC | PRN
  Administered 2015-06-19: 120 ug/kg/min via INTRAVENOUS

## 2015-06-19 MED ORDER — PROPOFOL 500 MG/50ML IV EMUL
INTRAVENOUS | Status: DC | PRN
  Administered 2015-06-19 (×2): 20 mg via INTRAVENOUS
  Administered 2015-06-19: 60 mg via INTRAVENOUS
  Administered 2015-06-19 (×2): 20 mg via INTRAVENOUS

## 2015-06-19 MED ORDER — LACTATED RINGERS IV SOLN
INTRAVENOUS | Status: DC
  Administered 2015-06-19: 1000 mL via INTRAVENOUS

## 2015-06-19 MED ORDER — PROPOFOL 10 MG/ML IV BOLUS
INTRAVENOUS | Status: AC
  Filled 2015-06-19: qty 20

## 2015-06-19 MED ORDER — PROPOFOL 10 MG/ML IV BOLUS
INTRAVENOUS | Status: AC
  Filled 2015-06-19: qty 40

## 2015-06-19 SURGICAL SUPPLY — 24 items

## 2015-06-19 NOTE — H&P (Signed)
  Problem: Small-volume hematochezia. Resolved coffee ground emesis. 03/03/2013 normal surveillance colonoscopy was performed. History of adenomatous colon polyp removed colonoscopically in the past. Family history of gastric cancer.  History: The patient is a 55 year old female born 10-Apr-1961. She has chronic gastroesophageal reflux and takes chronic proton pump inhibitor therapy. She does not take nonsteroidal anti-inflammatory medication. Her father was diagnosed with stomach cancer. She has a history of adenomatous colon polyps removed colonoscopically in the past. She underwent a normal surveillance colonoscopy in October 2014.  Last month, the patient developed a viral-like gastroenteritis associated with protracted nausea, vomiting, and diarrhea. She had 1 episode of coffee ground emesis. Her vomiting and diarrhea have resolved. She reports no dysphagia, odynophagia, or epigastric pain. There is no history of peptic ulcer disease.  Over the past few weeks she has had chronic small-volume hematochezia with her formed bowel movements unassociated with anal pain, rectal pain, or abdominal pain. She does have prolapsed internal hemorrhoids and has been evaluated for surgery in the past.  The patient is scheduled to undergo esophagogastroduodenoscopy and colonoscopy today.  Past medical history: Hypertension. Hypercholesterolemia. Gastroesophageal reflux. Major depression with anxiety. Rheumatoid arthritis. Prolapsed hemorrhoids. Cholecystectomy.  Medication allergies: None  Exam: The patient is alert and lying comfortably on the endoscopy stretcher. Abdomen is soft and nontender to palpation. Lungs are clear to auscultation. Cardiac exam reveals a regular rhythm.  Plan: Proceed with esophagogastroduodenoscopy and colonoscopy

## 2015-06-19 NOTE — Anesthesia Postprocedure Evaluation (Signed)
Anesthesia Post Note  Patient: Sandra Henderson  Procedure(s) Performed: Procedure(s) (LRB): COLONOSCOPY WITH PROPOFOL (N/A) ESOPHAGOGASTRODUODENOSCOPY (EGD) WITH PROPOFOL (N/A)  Patient location during evaluation: Endoscopy Anesthesia Type: MAC Level of consciousness: awake, awake and alert, oriented and patient cooperative Pain management: pain level controlled Vital Signs Assessment: post-procedure vital signs reviewed and stable Respiratory status: spontaneous breathing and respiratory function stable Cardiovascular status: blood pressure returned to baseline Anesthetic complications: no    Last Vitals:  Filed Vitals:   06/19/15 1228 06/19/15 1338  BP: 134/82   Pulse: 87 86  Temp: 36.5 C 36.7 C  Resp: 19     Last Pain: There were no vitals filed for this visit.               Christien Frankl EDWARD

## 2015-06-19 NOTE — Op Note (Signed)
Problem: Resolved coffee-ground emesis. History of adenomatous colon polyps removed colonoscopically in the past. Normal surveillance colonoscopy performed on 03/03/2013. Intermittent small-volume hematochezia. Chronic gastroesophageal reflux  Endoscopist: Danise Edge  Premedication: Propofol administered by anesthesia  Procedure: Diagnostic esophagogastroduodenoscopy The patient was placed in the left lateral decubitus position. The Pentax gastroscope was passed through the posterior hypopharynx into the proximal esophagus without difficulty. I did not visualize the vocal cords.  Esophagoscopy: The proximal, mid, and lower segments of the esophageal mucosa appeared normal. The squamocolumnar junction was regular in appearance and noted at approximately 35 cm from the incisor teeth. There was no endoscopic evidence for the presence of erosive esophagitis or Barrett's esophagus.  Gastroscopy: Retroflex view of the gastric cardia and fundus was normal. The gastric body, antrum, and pylorus appeared normal. Biopsies were performed to look for H. pylori gastritis  Duodenoscopy: The duodenal bulb and descending duodenum appeared normal.  Assessment: Normal esophagogastroduodenoscopy. Screen for H. pylori gastritis pending  Procedure: Surveillance colonoscopy Anal inspection and digital rectal exam were normal. The Pentax pediatric colonoscope was introduced into the rectum and advanced to the cecum. A normal-appearing ileocecal valve and appendiceal orifice were identified. Colonic preparation for the exam today was good. Withdrawal time was 7 minutes  Rectum. Normal. Retroflex view of the distal rectum was normal  Sigmoid colon and descending colon. Normal  Splenic flexure. Normal  Transverse colon. Normal  Hepatic flexure. Normal  Ascending colon. Colonic diverticulosis  Cecum and ileocecal valve.  Assessment: Normal surveillance colonoscopy  Recommendation: Schedule repeat  surveillance colonoscopy in 5 years

## 2015-06-19 NOTE — Anesthesia Preprocedure Evaluation (Addendum)
Anesthesia Evaluation  Patient identified by MRN, date of birth, ID band Patient awake    Reviewed: Allergy & Precautions, NPO status , Patient's Chart, lab work & pertinent test results  Airway Mallampati: I  TM Distance: >3 FB     Dental   Pulmonary    Pulmonary exam normal        Cardiovascular hypertension, Normal cardiovascular exam     Neuro/Psych Anxiety    GI/Hepatic GERD  ,(+)     substance abuse  alcohol use,   Endo/Other    Renal/GU Renal disease     Musculoskeletal  (+) Arthritis ,   Abdominal   Peds  Hematology   Anesthesia Other Findings   Reproductive/Obstetrics                            Anesthesia Physical Anesthesia Plan  ASA: II  Anesthesia Plan: MAC   Post-op Pain Management:    Induction: Intravenous  Airway Management Planned: Mask  Additional Equipment:   Intra-op Plan:   Post-operative Plan:   Informed Consent: I have reviewed the patients History and Physical, chart, labs and discussed the procedure including the risks, benefits and alternatives for the proposed anesthesia with the patient or authorized representative who has indicated his/her understanding and acceptance.     Plan Discussed with: Anesthesiologist, CRNA and Surgeon  Anesthesia Plan Comments:         Anesthesia Quick Evaluation

## 2015-06-19 NOTE — Transfer of Care (Signed)
Immediate Anesthesia Transfer of Care Note  Patient: Sandra Henderson  Procedure(s) Performed: Procedure(s): COLONOSCOPY WITH PROPOFOL (N/A) ESOPHAGOGASTRODUODENOSCOPY (EGD) WITH PROPOFOL (N/A)  Patient Location: PACU  Anesthesia Type:MAC  Level of Consciousness: awake, alert  and oriented  Airway & Oxygen Therapy: Patient Spontanous Breathing and Patient connected to nasal cannula oxygen  Post-op Assessment: Report given to RN and Post -op Vital signs reviewed and stable  Post vital signs: Reviewed and stable  Last Vitals:  Filed Vitals:   06/19/15 1228  BP: 134/82  Pulse: 87  Temp: 36.5 C  Resp: 19    Complications: No apparent anesthesia complications

## 2015-06-19 NOTE — Discharge Instructions (Signed)
Colonoscopy, Care After °These instructions give you information on caring for yourself after your procedure. Your doctor may also give you more specific instructions. Call your doctor if you have any problems or questions after your procedure. °HOME CARE °· Do not drive for 24 hours. °· Do not sign important papers or use machinery for 24 hours. °· You may shower. °· You may go back to your usual activities, but go slower for the first 24 hours. °· Take rest breaks often during the first 24 hours. °· Walk around or use warm packs on your belly (abdomen) if you have belly cramping or gas. °· Drink enough fluids to keep your pee (urine) clear or pale yellow. °· Resume your normal diet. Avoid heavy or fried foods. °· Avoid drinking alcohol for 24 hours or as told by your doctor. °· Only take medicines as told by your doctor. °If a tissue sample (biopsy) was taken during the procedure:  °· Do not take aspirin or blood thinners for 7 days, or as told by your doctor. °· Do not drink alcohol for 7 days, or as told by your doctor. °· Eat soft foods for the first 24 hours. °GET HELP IF: °You still have a small amount of blood in your poop (stool) 2-3 days after the procedure. °GET HELP RIGHT AWAY IF: °· You have more than a small amount of blood in your poop. °· You see clumps of tissue (blood clots) in your poop. °· Your belly is puffy (swollen). °· You feel sick to your stomach (nauseous) or throw up (vomit). °· You have a fever. °· You have belly pain that gets worse and medicine does not help. °MAKE SURE YOU: °· Understand these instructions. °· Will watch your condition. °· Will get help right away if you are not doing well or get worse. °  °This information is not intended to replace advice given to you by your health care provider. Make sure you discuss any questions you have with your health care provider. °  °Document Released: 05/31/2010 Document Revised: 05/03/2013 Document Reviewed: 01/03/2013 °Elsevier  Interactive Patient Education ©2016 [employer] Inc[employer] °Esophagogastroduodenoscopy, Care After °Refer to this sheet in the next few weeks. These instructions provide you with information about caring for yourself after your procedure. Your health care provider may also give you more specific instructions. Your treatment has been planned according to current medical practices, but problems sometimes occur. Call your health care provider if you have any problems or questions after your procedure. °WHAT TO EXPECT AFTER THE PROCEDURE °After your procedure, it is typical to feel: °· Soreness in your throat. °· Pain with swallowing. °· Sick to your stomach (nauseous). °· Bloated. °· Dizzy. °· Fatigued. °HOME CARE INSTRUCTIONS °· Do not eat or drink anything until the numbing medicine (local anesthetic) has worn off and your gag reflex has returned. You will know that the local anesthetic has worn off when you can swallow comfortably. °· Do not drive or operate machinery until directed by your health care provider. °· Take medicines only as directed by your health care provider. °SEEK MEDICAL CARE IF:  °· You cannot stop coughing. °· You are not urinating at all or less than usual. °SEEK IMMEDIATE MEDICAL CARE IF: °· You have difficulty swallowing. °· You cannot eat or drink. °· You have worsening throat or chest pain. °· You have dizziness or lightheadedness or you faint. °· You have nausea or vomiting. °· You have chills. °· You have a fever. °·   You have severe abdominal pain. °· You have black, tarry, or bloody stools. °  °This information is not intended to replace advice given to you by your health care provider. Make sure you discuss any questions you have with your health care provider. °  °Document Released: 04/14/2012 Document Revised: 05/19/2014 Document Reviewed: 04/14/2012 °Elsevier Interactive Patient Education ©2016 Elsevier Inc. ° °

## 2015-06-20 ENCOUNTER — Encounter (HOSPITAL_COMMUNITY): Payer: Self-pay | Admitting: Gastroenterology

## 2015-12-21 DIAGNOSIS — Z0000 Encounter for general adult medical examination without abnormal findings: Secondary | ICD-10-CM | POA: Diagnosis not present

## 2015-12-21 DIAGNOSIS — Z1389 Encounter for screening for other disorder: Secondary | ICD-10-CM | POA: Diagnosis not present

## 2015-12-21 DIAGNOSIS — R7309 Other abnormal glucose: Secondary | ICD-10-CM | POA: Diagnosis not present

## 2015-12-21 DIAGNOSIS — Z1159 Encounter for screening for other viral diseases: Secondary | ICD-10-CM | POA: Diagnosis not present

## 2016-04-08 ENCOUNTER — Other Ambulatory Visit: Payer: Self-pay | Admitting: Internal Medicine

## 2016-04-08 DIAGNOSIS — Z1231 Encounter for screening mammogram for malignant neoplasm of breast: Secondary | ICD-10-CM

## 2016-04-11 ENCOUNTER — Ambulatory Visit
Admission: RE | Admit: 2016-04-11 | Discharge: 2016-04-11 | Disposition: A | Discharge status: Home / Self Care | Payer: BLUE CROSS/BLUE SHIELD | Source: Ambulatory Visit | Attending: Internal Medicine | Admitting: Internal Medicine

## 2016-04-11 DIAGNOSIS — Z1231 Encounter for screening mammogram for malignant neoplasm of breast: Secondary | ICD-10-CM | POA: Diagnosis not present

## 2016-04-22 DIAGNOSIS — Z01419 Encounter for gynecological examination (general) (routine) without abnormal findings: Secondary | ICD-10-CM | POA: Diagnosis not present

## 2016-04-22 DIAGNOSIS — K649 Unspecified hemorrhoids: Secondary | ICD-10-CM | POA: Diagnosis not present

## 2016-04-22 DIAGNOSIS — Z6838 Body mass index (BMI) 38.0-38.9, adult: Secondary | ICD-10-CM | POA: Diagnosis not present

## 2016-04-22 DIAGNOSIS — Z124 Encounter for screening for malignant neoplasm of cervix: Secondary | ICD-10-CM | POA: Diagnosis not present

## 2016-05-06 DIAGNOSIS — L814 Other melanin hyperpigmentation: Secondary | ICD-10-CM | POA: Diagnosis not present

## 2016-05-06 DIAGNOSIS — D1801 Hemangioma of skin and subcutaneous tissue: Secondary | ICD-10-CM | POA: Diagnosis not present

## 2016-05-06 DIAGNOSIS — D225 Melanocytic nevi of trunk: Secondary | ICD-10-CM | POA: Diagnosis not present

## 2016-05-06 DIAGNOSIS — L821 Other seborrheic keratosis: Secondary | ICD-10-CM | POA: Diagnosis not present

## 2016-06-17 DIAGNOSIS — F324 Major depressive disorder, single episode, in partial remission: Secondary | ICD-10-CM | POA: Diagnosis not present

## 2016-06-17 DIAGNOSIS — E7800 Pure hypercholesterolemia, unspecified: Secondary | ICD-10-CM | POA: Diagnosis not present

## 2016-06-17 DIAGNOSIS — K219 Gastro-esophageal reflux disease without esophagitis: Secondary | ICD-10-CM | POA: Diagnosis not present

## 2016-06-17 DIAGNOSIS — I10 Essential (primary) hypertension: Secondary | ICD-10-CM | POA: Diagnosis not present

## 2016-06-17 DIAGNOSIS — Z23 Encounter for immunization: Secondary | ICD-10-CM | POA: Diagnosis not present

## 2017-03-18 ENCOUNTER — Other Ambulatory Visit: Payer: Self-pay | Admitting: Obstetrics and Gynecology

## 2017-03-18 DIAGNOSIS — Z1231 Encounter for screening mammogram for malignant neoplasm of breast: Secondary | ICD-10-CM

## 2017-04-16 ENCOUNTER — Ambulatory Visit
Admission: RE | Admit: 2017-04-16 | Discharge: 2017-04-16 | Disposition: A | Discharge status: Home / Self Care | Payer: BLUE CROSS/BLUE SHIELD | Source: Ambulatory Visit | Attending: Obstetrics and Gynecology | Admitting: Obstetrics and Gynecology

## 2017-04-16 DIAGNOSIS — Z1231 Encounter for screening mammogram for malignant neoplasm of breast: Secondary | ICD-10-CM

## 2017-04-17 ENCOUNTER — Ambulatory Visit: Payer: BLUE CROSS/BLUE SHIELD

## 2017-04-27 DIAGNOSIS — Z124 Encounter for screening for malignant neoplasm of cervix: Secondary | ICD-10-CM | POA: Diagnosis not present

## 2017-04-27 DIAGNOSIS — D1801 Hemangioma of skin and subcutaneous tissue: Secondary | ICD-10-CM | POA: Diagnosis not present

## 2017-04-27 DIAGNOSIS — L814 Other melanin hyperpigmentation: Secondary | ICD-10-CM | POA: Diagnosis not present

## 2017-04-27 DIAGNOSIS — L821 Other seborrheic keratosis: Secondary | ICD-10-CM | POA: Diagnosis not present

## 2017-04-27 DIAGNOSIS — N390 Urinary tract infection, site not specified: Secondary | ICD-10-CM | POA: Diagnosis not present

## 2017-04-27 DIAGNOSIS — Z01419 Encounter for gynecological examination (general) (routine) without abnormal findings: Secondary | ICD-10-CM | POA: Diagnosis not present

## 2017-04-27 DIAGNOSIS — D225 Melanocytic nevi of trunk: Secondary | ICD-10-CM | POA: Diagnosis not present

## 2017-04-27 DIAGNOSIS — K649 Unspecified hemorrhoids: Secondary | ICD-10-CM | POA: Diagnosis not present

## 2017-08-11 DIAGNOSIS — Z0000 Encounter for general adult medical examination without abnormal findings: Secondary | ICD-10-CM | POA: Diagnosis not present

## 2017-08-11 DIAGNOSIS — Z1389 Encounter for screening for other disorder: Secondary | ICD-10-CM | POA: Diagnosis not present

## 2017-08-11 DIAGNOSIS — E669 Obesity, unspecified: Secondary | ICD-10-CM | POA: Diagnosis not present

## 2017-08-11 DIAGNOSIS — E7800 Pure hypercholesterolemia, unspecified: Secondary | ICD-10-CM | POA: Diagnosis not present

## 2017-08-11 DIAGNOSIS — R7309 Other abnormal glucose: Secondary | ICD-10-CM | POA: Diagnosis not present

## 2018-02-11 DIAGNOSIS — E7800 Pure hypercholesterolemia, unspecified: Secondary | ICD-10-CM | POA: Diagnosis not present

## 2018-02-11 DIAGNOSIS — I10 Essential (primary) hypertension: Secondary | ICD-10-CM | POA: Diagnosis not present

## 2018-02-11 DIAGNOSIS — F324 Major depressive disorder, single episode, in partial remission: Secondary | ICD-10-CM | POA: Diagnosis not present

## 2018-02-11 DIAGNOSIS — Z23 Encounter for immunization: Secondary | ICD-10-CM | POA: Diagnosis not present

## 2018-02-11 DIAGNOSIS — M069 Rheumatoid arthritis, unspecified: Secondary | ICD-10-CM | POA: Diagnosis not present

## 2018-04-27 DIAGNOSIS — D225 Melanocytic nevi of trunk: Secondary | ICD-10-CM | POA: Diagnosis not present

## 2018-04-27 DIAGNOSIS — L814 Other melanin hyperpigmentation: Secondary | ICD-10-CM | POA: Diagnosis not present

## 2018-04-27 DIAGNOSIS — L821 Other seborrheic keratosis: Secondary | ICD-10-CM | POA: Diagnosis not present

## 2018-04-27 DIAGNOSIS — L304 Erythema intertrigo: Secondary | ICD-10-CM | POA: Diagnosis not present

## 2018-05-17 DIAGNOSIS — R350 Frequency of micturition: Secondary | ICD-10-CM | POA: Diagnosis not present

## 2018-05-17 DIAGNOSIS — Z01419 Encounter for gynecological examination (general) (routine) without abnormal findings: Secondary | ICD-10-CM | POA: Diagnosis not present

## 2018-05-17 DIAGNOSIS — K649 Unspecified hemorrhoids: Secondary | ICD-10-CM | POA: Diagnosis not present

## 2018-05-17 DIAGNOSIS — Z1231 Encounter for screening mammogram for malignant neoplasm of breast: Secondary | ICD-10-CM | POA: Diagnosis not present

## 2018-05-17 DIAGNOSIS — Z6838 Body mass index (BMI) 38.0-38.9, adult: Secondary | ICD-10-CM | POA: Diagnosis not present

## 2018-05-17 DIAGNOSIS — Z124 Encounter for screening for malignant neoplasm of cervix: Secondary | ICD-10-CM | POA: Diagnosis not present

## 2018-05-27 DIAGNOSIS — N939 Abnormal uterine and vaginal bleeding, unspecified: Secondary | ICD-10-CM | POA: Diagnosis not present

## 2018-06-02 ENCOUNTER — Other Ambulatory Visit: Payer: Self-pay | Admitting: Obstetrics and Gynecology

## 2018-06-02 ENCOUNTER — Ambulatory Visit
Admission: RE | Admit: 2018-06-02 | Discharge: 2018-06-02 | Disposition: A | Discharge status: Home / Self Care | Payer: BLUE CROSS/BLUE SHIELD | Source: Ambulatory Visit | Attending: Obstetrics and Gynecology | Admitting: Obstetrics and Gynecology

## 2018-06-02 DIAGNOSIS — N939 Abnormal uterine and vaginal bleeding, unspecified: Secondary | ICD-10-CM | POA: Diagnosis not present

## 2018-06-08 DIAGNOSIS — N939 Abnormal uterine and vaginal bleeding, unspecified: Secondary | ICD-10-CM | POA: Diagnosis not present

## 2018-06-09 DIAGNOSIS — N950 Postmenopausal bleeding: Secondary | ICD-10-CM | POA: Diagnosis not present

## 2018-06-18 ENCOUNTER — Other Ambulatory Visit: Payer: Self-pay | Admitting: Obstetrics and Gynecology

## 2018-06-18 DIAGNOSIS — N840 Polyp of corpus uteri: Secondary | ICD-10-CM | POA: Diagnosis not present

## 2018-06-18 DIAGNOSIS — N950 Postmenopausal bleeding: Secondary | ICD-10-CM | POA: Diagnosis not present

## 2018-06-29 DIAGNOSIS — N950 Postmenopausal bleeding: Secondary | ICD-10-CM | POA: Diagnosis not present

## 2018-10-20 DIAGNOSIS — E7800 Pure hypercholesterolemia, unspecified: Secondary | ICD-10-CM | POA: Diagnosis not present

## 2018-10-20 DIAGNOSIS — Z0000 Encounter for general adult medical examination without abnormal findings: Secondary | ICD-10-CM | POA: Diagnosis not present

## 2018-10-20 DIAGNOSIS — D473 Essential (hemorrhagic) thrombocythemia: Secondary | ICD-10-CM | POA: Diagnosis not present

## 2018-10-20 DIAGNOSIS — Z1389 Encounter for screening for other disorder: Secondary | ICD-10-CM | POA: Diagnosis not present

## 2018-10-20 DIAGNOSIS — I10 Essential (primary) hypertension: Secondary | ICD-10-CM | POA: Diagnosis not present

## 2018-10-20 DIAGNOSIS — E559 Vitamin D deficiency, unspecified: Secondary | ICD-10-CM | POA: Diagnosis not present

## 2018-10-20 DIAGNOSIS — F324 Major depressive disorder, single episode, in partial remission: Secondary | ICD-10-CM | POA: Diagnosis not present

## 2018-11-17 DIAGNOSIS — K625 Hemorrhage of anus and rectum: Secondary | ICD-10-CM | POA: Diagnosis not present

## 2018-12-15 DIAGNOSIS — K5730 Diverticulosis of large intestine without perforation or abscess without bleeding: Secondary | ICD-10-CM | POA: Diagnosis not present

## 2018-12-15 DIAGNOSIS — K644 Residual hemorrhoidal skin tags: Secondary | ICD-10-CM | POA: Diagnosis not present

## 2018-12-15 DIAGNOSIS — K921 Melena: Secondary | ICD-10-CM | POA: Diagnosis not present

## 2018-12-15 DIAGNOSIS — K649 Unspecified hemorrhoids: Secondary | ICD-10-CM | POA: Diagnosis not present

## 2018-12-15 DIAGNOSIS — R194 Change in bowel habit: Secondary | ICD-10-CM | POA: Diagnosis not present

## 2019-02-16 DIAGNOSIS — M25562 Pain in left knee: Secondary | ICD-10-CM | POA: Diagnosis not present

## 2019-02-16 DIAGNOSIS — M25571 Pain in right ankle and joints of right foot: Secondary | ICD-10-CM | POA: Diagnosis not present

## 2019-03-01 DIAGNOSIS — M25571 Pain in right ankle and joints of right foot: Secondary | ICD-10-CM | POA: Diagnosis not present

## 2019-03-14 DIAGNOSIS — M25571 Pain in right ankle and joints of right foot: Secondary | ICD-10-CM | POA: Diagnosis not present

## 2019-05-09 DIAGNOSIS — I10 Essential (primary) hypertension: Secondary | ICD-10-CM | POA: Diagnosis not present

## 2019-05-09 DIAGNOSIS — F331 Major depressive disorder, recurrent, moderate: Secondary | ICD-10-CM | POA: Diagnosis not present

## 2019-05-09 DIAGNOSIS — M7989 Other specified soft tissue disorders: Secondary | ICD-10-CM | POA: Diagnosis not present

## 2019-06-02 DIAGNOSIS — Z1211 Encounter for screening for malignant neoplasm of colon: Secondary | ICD-10-CM | POA: Diagnosis not present

## 2019-06-02 DIAGNOSIS — Z01419 Encounter for gynecological examination (general) (routine) without abnormal findings: Secondary | ICD-10-CM | POA: Diagnosis not present

## 2019-06-02 DIAGNOSIS — Z6839 Body mass index (BMI) 39.0-39.9, adult: Secondary | ICD-10-CM | POA: Diagnosis not present

## 2019-06-02 DIAGNOSIS — R3915 Urgency of urination: Secondary | ICD-10-CM | POA: Diagnosis not present

## 2019-06-02 DIAGNOSIS — E559 Vitamin D deficiency, unspecified: Secondary | ICD-10-CM | POA: Diagnosis not present

## 2019-06-02 DIAGNOSIS — Z1231 Encounter for screening mammogram for malignant neoplasm of breast: Secondary | ICD-10-CM | POA: Diagnosis not present

## 2019-06-06 ENCOUNTER — Ambulatory Visit: Payer: BLUE CROSS/BLUE SHIELD | Admitting: Registered"

## 2019-06-07 ENCOUNTER — Encounter: Payer: Self-pay | Admitting: Registered"

## 2019-06-07 ENCOUNTER — Other Ambulatory Visit: Payer: Self-pay

## 2019-06-07 ENCOUNTER — Encounter: Payer: BC Managed Care – PPO | Attending: Physician Assistant | Admitting: Registered"

## 2019-06-07 DIAGNOSIS — E669 Obesity, unspecified: Secondary | ICD-10-CM | POA: Diagnosis not present

## 2019-06-07 NOTE — Patient Instructions (Signed)
Focus on proportions instead of portions.  Aim to get 2 meals a day, 7 days a week to match the healthy plate.  Take a moment to think about why you are snacking when you are bored.  Work to incorporate low cholesterol, low saturated fat, and low sodium into your diet.  Keep up working with your support system!!

## 2019-06-07 NOTE — Progress Notes (Signed)
  Medical Nutrition Therapy:  Appt start time: 1115 end time:  1220.   Assessment:  Primary concerns today: weight management/health.  Pt plans on retitring this year. Expresses intent on self care. Pt wants help on accountability. Pt loves to cook. Pt formerly was on weight watchers. Pt reports snacking more in the evenings, at times in excess. Pt reports eating quickly for lunch, work is demanding and doesn't have time. Pt reports feeling that she is under pressure all week.  Pt reports having a sister who she is very close with and can talk to. A good support network, good marriage.  Pt reports IBS. Says dairy gives her symptoms, higher fat items. Pt seems to need to perform at a high level at work, and expresses need do the best.  Preferred Learning Style:   No preference indicated   Learning Readiness:   Contemplating    MEDICATIONS: See list   DIETARY INTAKE:  Usual eating pattern includes 3 meals and 1-2 snacks per day.  24-hr recall:  B ( AM): Oatmeal, (protein, low sugar, cranberry almond) Snk ( AM): none reported L ( PM): Sandwich, mayonaise, deli meat, lettuce, pineapple, chips (sometimes). Or leftovers Snk ( PM): 10-12 wheat thins crackers, 1 baybel cheese D ( PM): Spaghetti w/ meatballs (1 cup pasta), salad Snk ( PM): vanilla wafers with reddi whip (6) Beverages: wine 2-3 glasses, 4 12 oz glasses of water.  Usual physical activity: Pt reports none. States difficulty in finding time.   Progress Towards Goal(s):  Some progress.   Nutritional Diagnosis:  NB-1.1 Food and nutrition-related knowledge deficit As related to lack of prioritizing self care.  As evidenced by Pt admission of caring for others more than herself.    Intervention:  Nutrition Education.   Focus on proportions instead of portions.  Aim to get 2 meals a day, 7 days a week to match the healthy plate.  Take a moment to think about why you are snacking when you are bored.  Work to  incorporate low cholesterol, low saturated fat, and low sodium into your diet.  Keep up working with your support system!!  Teaching Method Utilized:  Visual Auditory  Handouts given during visit include:  Diabetic myplate  NCM Stroke Diet  Barriers to learning/adherence to lifestyle change: Highly demanding career  Demonstrated degree of understanding via:  Teach Back   Monitoring/Evaluation:  Dietary intake, exercise, progression of self care, and body weight in 6 week(s).

## 2019-06-08 DIAGNOSIS — Z6841 Body Mass Index (BMI) 40.0 and over, adult: Secondary | ICD-10-CM | POA: Diagnosis not present

## 2019-06-08 DIAGNOSIS — F324 Major depressive disorder, single episode, in partial remission: Secondary | ICD-10-CM | POA: Diagnosis not present

## 2019-06-08 DIAGNOSIS — I10 Essential (primary) hypertension: Secondary | ICD-10-CM | POA: Diagnosis not present

## 2019-06-16 ENCOUNTER — Other Ambulatory Visit: Payer: Self-pay | Admitting: Obstetrics and Gynecology

## 2019-06-16 DIAGNOSIS — M8580 Other specified disorders of bone density and structure, unspecified site: Secondary | ICD-10-CM

## 2019-07-05 ENCOUNTER — Other Ambulatory Visit: Payer: Self-pay

## 2019-07-05 ENCOUNTER — Encounter (INDEPENDENT_AMBULATORY_CARE_PROVIDER_SITE_OTHER): Payer: Self-pay | Admitting: Family Medicine

## 2019-07-05 ENCOUNTER — Ambulatory Visit (INDEPENDENT_AMBULATORY_CARE_PROVIDER_SITE_OTHER): Payer: BC Managed Care – PPO | Admitting: Family Medicine

## 2019-07-05 VITALS — BP 116/74 | HR 86 | Temp 98.1°F | Ht 62.0 in | Wt 216.0 lb

## 2019-07-05 DIAGNOSIS — E559 Vitamin D deficiency, unspecified: Secondary | ICD-10-CM | POA: Diagnosis not present

## 2019-07-05 DIAGNOSIS — F418 Other specified anxiety disorders: Secondary | ICD-10-CM | POA: Diagnosis not present

## 2019-07-05 DIAGNOSIS — Z9189 Other specified personal risk factors, not elsewhere classified: Secondary | ICD-10-CM

## 2019-07-05 DIAGNOSIS — E7849 Other hyperlipidemia: Secondary | ICD-10-CM

## 2019-07-05 DIAGNOSIS — R0602 Shortness of breath: Secondary | ICD-10-CM

## 2019-07-05 DIAGNOSIS — R739 Hyperglycemia, unspecified: Secondary | ICD-10-CM

## 2019-07-05 DIAGNOSIS — I10 Essential (primary) hypertension: Secondary | ICD-10-CM | POA: Diagnosis not present

## 2019-07-05 DIAGNOSIS — R5383 Other fatigue: Secondary | ICD-10-CM

## 2019-07-05 DIAGNOSIS — Z6839 Body mass index (BMI) 39.0-39.9, adult: Secondary | ICD-10-CM

## 2019-07-05 DIAGNOSIS — Z0289 Encounter for other administrative examinations: Secondary | ICD-10-CM

## 2019-07-05 NOTE — Progress Notes (Signed)
Office: [phone]  /  Fax: [phone]    Date: July 06, 2019   Appointment Start Time: 11:59am Duration: 48 minutes Provider: Glennie Isle, Psy.D. Type of Session: Intake for Individual Therapy  Location of Patient: Home Location of Provider: Provider's Home Type of Contact: Telepsychological Visit via [employer]  Informed Consent: Prior to proceeding with today's appointment, two pieces of identifying information were obtained. In addition, Elizaveta's physical location at the time of this appointment was obtained as well a phone number she could be reached at in the event of technical difficulties. Wanza and this provider participated in today's telepsychological service.   The provider's role was explained to Erenest Rasher. The provider reviewed and discussed issues of confidentiality, privacy, and limits therein (e.g., reporting obligations). In addition to verbal informed consent, written informed consent for psychological services was obtained prior to the initial appointment. Since the clinic is not a 24/7 crisis center, mental health emergency resources were shared and this  provider explained MyChart, e-mail, voicemail, and/or other messaging systems should be utilized only for non-emergency reasons. This provider also explained that information obtained during appointments will be placed in Rashel's medical record and relevant information will be shared with other providers at [facility] for coordination of care. Moreover, Marisah agreed information may be shared with other [facility] providers as needed for coordination of care. By signing the service agreement document, Kahli provided written consent for coordination of care. Prior to initiating telepsychological services, Valery completed an informed consent document, which included the development of a safety plan (i.e., an emergency contact, nearest emergency room, and emergency resources) in  the event of an emergency/crisis. Vonceil expressed understanding of the rationale of the safety plan. Heavin verbally acknowledged understanding she is ultimately responsible for understanding her insurance benefits for telepsychological and in-person services. This provider also reviewed confidentiality, as it relates to telepsychological services, as well as the rationale for telepsychological services (i.e., to reduce exposure risk to COVID-19). Jalayia  acknowledged understanding that appointments cannot be recorded without both party consent and she is aware she is responsible for securing confidentiality on her end of the session. Saoirse verbally consented to proceed.  Chief Complaint/HPI: Sharlene was referred by Dr. Dennard Nip due to depression with anxiety. Per the note for the initial visit with Dr. Dennard Nip on July 05, 2019, "Kelli is on Xanax and Wellbutrin (just stopped this recently). She notes significant emotional eating in the past." The note for the initial appointment with Dr. Dennard Nip indicated the following: " Her family eats meals together, she thinks her family will eat healthier with her, her desired weight loss is 76 lbs, she started gaining weight after having children, her heaviest weight ever was 220-225 pounds, she has significant food cravings issues, she snacks frequently in the evenings, she is frequently drinking liquids with calories, she frequently makes poor food choices, she has problems with excessive hunger, she frequently eats larger portions than normal and she struggles with emotional eating." [place]'s Food and Mood (modified PHQ-9) score on July 05, 2019 was 13.  During today's appointment, Genny was verbally administered a questionnaire assessing various behaviors related to emotional eating. Lavera endorsed the following: overeat when you are celebrating, experience food cravings on a regular basis, eat certain foods when you are anxious, stressed,  depressed, or your feelings are hurt, use food to help you cope with emotional situations, find food is comforting to you, overeat when you are angry or  upset, overeat when you are worried about something, overeat frequently when you are bored or lonely, not worry about what you eat when you are in a good mood, overeat when you are alone, but eat much less when you are with other people and eat as a reward. She shared she craves salty foods (e.g., chips, casseroles) and occasionally sweets. Aleksis believes the onset of emotional eating was likely in childhood and described the current frequency of emotional eating as "a few times a week." In addition, Tynetta denied a history of binge eating. Delaina denied a history of restricting food intake, purging and engagement in other compensatory strategies, and has never been diagnosed with an eating disorder. She also denied a history of treatment for emotional eating. Moreover, Edilia indicated celebrations and stress triggers emotional eating, whereas connecting with friends makes emotional eating better. She added she has not "paid attention" to what she is eating in the past six months. Furthermore, Tallia denied other problems of concern.    Mental Status Examination:  Appearance: well groomed and appropriate hygiene  Behavior: appropriate to circumstances Mood: euthymic Affect: mood congruent Speech: normal in rate, volume, and tone Eye Contact: appropriate Psychomotor Activity: appropriate Gait: unable to assess Thought Process: linear, logical, and goal directed  Thought Content/Perception: denies suicidal and homicidal ideation, plan, and intent and no hallucinations, delusions, bizarre thinking or behavior reported or observed Orientation: time, person, place and purpose of appointment Memory/Concentration: memory, attention, language, and fund of knowledge intact  Insight/Judgment: good  Family & Psychosocial History: Kamarah reported she is  married and she has two adult children. She indicated she retired in the beginning of this month. Additionally, Shelise shared her highest level of education obtained is a bachelor's degree. Currently, Alonie's social support system consists of her husband, children, mother, friends, sisters, and brothers. Moreover, Mykael stated she resides with her husband.   Medical History: Past Medical History:  Diagnosis Date  . Anxiety   . Back pain   . Chest pain   . Depression   . Edema, lower extremity   . Family history of adverse reaction to anesthesia    "Mom's blood pressure bottoms out"   . Gallbladder disease   . GERD (gastroesophageal reflux disease)   . Hemorrhoid   . Hyperlipidemia   . Hypertension   . IBS (irritable bowel syndrome)   . Joint pain   . Kidney stones   . Obesity   . Rheumatoid arthritis ([place])   . Shortness of breath   . SUI (stress urinary incontinence, female)   . Vitamin D deficiency    Past Surgical History:  Procedure Laterality Date  . CHOLECYSTECTOMY OPEN  ~ 1988  . COLONOSCOPY WITH PROPOFOL N/A 06/19/2015   Procedure: COLONOSCOPY WITH PROPOFOL;  Surgeon: Garlan Fair, MD;  Location: [facility];  Service: Endoscopy;  Laterality: N/A;  . ESOPHAGOGASTRODUODENOSCOPY (EGD) WITH PROPOFOL N/A 06/19/2015   Procedure: ESOPHAGOGASTRODUODENOSCOPY (EGD) WITH PROPOFOL;  Surgeon: Garlan Fair, MD;  Location: [facility];  Service: Endoscopy;  Laterality: N/A;  . TONSILLECTOMY  1960's   Current Outpatient Medications on File Prior to Visit  Medication Sig Dispense Refill  . ALPRAZolam (XANAX) 1 MG tablet Take 0.5 mg by mouth daily as needed for anxiety.     Marland Kitchen amLODipine-olmesartan (AZOR) 5-20 MG per tablet Take 1 tablet by mouth daily.    . AZO-CRANBERRY PO Take 2 tablets by mouth daily.    Marland Kitchen buPROPion (WELLBUTRIN XL) 150 MG 24 hr tablet Take  150 mg by mouth daily.  0  . Ferrous Gluconate (IRON) 240 (27 Fe) MG TABS Take 1 tablet by mouth daily.    .  hydrochlorothiazide (HYDRODIURIL) 25 MG tablet Take 25 mg by mouth daily.    . hydrocortisone 2.5 % lotion Apply 1 application topically daily as needed (Rash).   0  . ibuprofen (ADVIL,MOTRIN) 200 MG tablet Take 400 mg by mouth every 6 (six) hours as needed (Pain).    . metroNIDAZOLE (METROGEL) 0.75 % gel Apply 1 application topically daily.    . Multiple Vitamins-Minerals (CENTRUM SILVER 50+WOMEN PO) Take 1 tablet by mouth daily.    Marland Kitchen Nystatin (NYAMYC) 100000 UNIT/GM POWD Apply 1 application topically daily as needed (Rash).   0  . nystatin cream (MYCOSTATIN) Apply 1 application topically daily as needed (Rash).     . Omega-3 Fatty Acids (FISH OIL) 1200 MG CAPS Take 1 capsule by mouth daily.    Marland Kitchen omeprazole (PRILOSEC) 20 MG capsule Take 20 mg by mouth daily.    . Potassium Gluconate 2 MEQ TABS Take 1 tablet by mouth daily.    . simvastatin (ZOCOR) 20 MG tablet Take 20 mg by mouth daily.    . Vitamin D, Ergocalciferol, (DRISDOL) 1.25 MG (50000 UNIT) CAPS capsule Take 50,000 Units by mouth every 7 (seven) days.     No current facility-administered medications on file prior to visit.  Claretha denied a history of head injuries, but recalled fainting in the past. She recalled the last time was approximately three years and there was LOC for approximately 30 seconds. She received medical attention.   Mental Health History: Nychelle reported she attended therapy at the age 56 due to depression and anxiety secondary to rheumatoid arthritis. She recalled attending services for "6 months to a year." In addition, Lovie stated she attended therapy in her early 67s for anxiety and depression for approximately 6 months. Laketia reported there is no history of hospitalizations for psychiatric concerns. She believes she previously met with a psychiatrist. Currently, Flora reported her PCP prescribed Xanax PRN. She reported her PCP prescribed Wellbutrin, but she discontinued it as she is feeling better. She reported  she informed her PCP. Jeanie endorsed a family history of mental health related concerns. She reported her paternal uncle and maternal grandmother suffered from depression. Kameran reported there is no history of trauma including psychological, physical  and sexual abuse, as well as neglect.   Lucillia described her typical mood lately as "pretty good," noting a decrease in anxiety since retiring. Aside from concerns noted above and endorsed on the PHQ-9 and GAD-7, Courtny reported experiencing some decreased motivation; crying spells; and panic attacks. She described the frequency of panic attacks as "every other month" and they are characterized by difficulty breathing, feeling she is going to die, and feeling fidgety. Medina stated she copes by taking Xanax, using grounding techniques, and talking to her husband. She stated she last experienced a panic attack in Fall 2020. Acire endorsed current alcohol use, adding "It's more than I want it to be." She indicated she consumed wine 4-5 times a week in the past year, noting sometimes she would consume a bottle of wine in one evening. Murlean reported a decrease in her alcohol use, noting she will now consume a couple standard pours of wine (i.e., 1/2 bottle of wine) 3-4 times a week. She denied consequences of her alcohol use nor has she received treatment. She also denied drinking and driving. She denied tobacco use. She  denied illicit/recreational substance use. Regarding caffeine intake, Desirea reported consuming a cup of tea 3-4 times a week. Furthermore, Aydin indicated she is not experiencing the following: memory concerns, hallucinations and delusions, paranoia, symptoms of mania (e.g., expansive mood, flighty ideas, decreased need for sleep, engagement in risky behaviors) and social withdrawal. She also denied history of and current suicidal ideation, plan, and intent; history of and current homicidal ideation, plan, and intent; and history of and current  engagement in self-harm.  The following strengths were reported by [facility]: communicate and connect well with others; empathic; and caring. The following strengths were observed by this provider: ability to express thoughts and feelings during the therapeutic session, ability to establish and benefit from a therapeutic relationship, willingness to work toward established goal(s) with the clinic and ability to engage in reciprocal conversation.  Legal History: Thailyn reported there is no history of legal involvement.   Structured Assessments Results: The Patient Health Questionnaire-9 (PHQ-9) is a self-report measure that assesses symptoms and severity of depression over the course of the last two weeks. Mishell obtained a score of 2 suggesting minimal depression. Jenavi finds the endorsed symptoms to be not difficult at all. [0= Not at all; 1= Several days; 2= More than half the days; 3= Nearly every day] Little interest or pleasure in doing things 0  Feeling down, depressed, or hopeless 0  Trouble falling or staying asleep, or sleeping too much 1  Feeling tired or having little energy 1  Poor appetite or overeating 0  Feeling bad about yourself --- or that you are a failure or have let yourself or your family down 0  Trouble concentrating on things, such as reading the newspaper or watching television 0  Moving or speaking so slowly that other people could have noticed? Or the opposite --- being so fidgety or restless that you have been moving around a lot more than usual 0  Thoughts that you would be better off dead or hurting yourself in some way 0  PHQ-9 Score 2    The Generalized Anxiety Disorder-7 (GAD-7) is a brief self-report measure that assesses symptoms of anxiety over the course of the last two weeks. Ernestene obtained a score of 3 suggesting minimal anxiety. Santanna finds the endorsed symptoms to be not difficult at all. [0= Not at all; 1= Several days; 2= Over half the days; 3= Nearly  every day] Feeling nervous, anxious, on edge 1  Not being able to stop or control worrying 1  Worrying too much about different things 1  Trouble relaxing 0  Being so restless that it's hard to sit still 0  Becoming easily annoyed or irritable 0  Feeling afraid as if something awful might happen 0  GAD-7 Score 3   Interventions:  Conducted a chart review Focused on rapport building Verbally administered PHQ-9 and GAD-7 for symptom monitoring Verbally administered Food & Mood questionnaire to assess various behaviors related to emotional eating. Provided emphatic reflections and validation Collaborated with patient on a treatment goal  Psychoeducation provided regarding physical versus emotional hunger  Provisional DSM-5 Diagnosis(es): 300.09 (F41.8) Other Specified Anxiety Disorder, Emotional Eating Behaviors and Limited-symptom attacks  Plan: Ethelda appears able and willing to participate as evidenced by collaboration on a treatment goal, engagement in reciprocal conversation, and asking questions as needed for clarification. The next appointment will be scheduled in two weeks, which will be via [employer]. The following treatment goal was established: decrease emotional eating. This provider will regularly review the treatment  plan and medical chart to keep informed of status changes. Sharmel expressed understanding and agreement with the initial treatment plan of care. Haylee will be sent a handout via e-mail to utilize between now and the next appointment to increase awareness of hunger patterns and subsequent eating. Sury provided verbal consent during today's appointment for this provider to send the handout via e-mail.

## 2019-07-05 NOTE — Progress Notes (Signed)
Chief Complaint:   Sandra Henderson (MR# [id number]) is a 59 y.o. female who presents for evaluation and treatment of obesity and related comorbidities. Current BMI is Body mass index is 39.51 kg/m. Sandra Henderson has been struggling with her weight for many years and has been unsuccessful in either losing weight, maintaining weight loss, or reaching her healthy weight goal.  Sandra Henderson is currently in the action stage of change and ready to dedicate time achieving and maintaining a healthier weight. Sandra Henderson is interested in becoming our patient and working on intensive lifestyle modifications including (but not limited to) diet and exercise for weight loss.  Sandra Henderson's habits were reviewed today and are as follows: Her family eats meals together, she thinks her family will eat healthier with her, her desired weight loss is 76 lbs, she started gaining weight after having children, her heaviest weight ever was 220-225 pounds, she has significant food cravings issues, she snacks frequently in the evenings, she is frequently drinking liquids with calories, she frequently makes poor food choices, she has problems with excessive hunger, she frequently eats larger portions than normal and she struggles with emotional eating.  Depression Screen Sandra Henderson's Food and Mood (modified PHQ-9) score was 13.  Depression screen [facility] 2/9 07/05/2019  Decreased Interest 3  Down, Depressed, Hopeless 3  PHQ - 2 Score 6  Altered sleeping 2  Tired, decreased energy 2  Change in appetite 1  Feeling bad or failure about yourself  1  Trouble concentrating 0  Moving slowly or fidgety/restless 1  Suicidal thoughts 0  PHQ-9 Score 13  Difficult doing work/chores Not difficult at all   Subjective:   1. Other fatigue Sandra Henderson admits to daytime somnolence and admits to waking up still tired. Patent has a history of symptoms of daytime fatigue. Sandra Henderson generally gets 6 or 7 hours of sleep per night, and states that she has  difficulty falling back asleep if awakened. Snoring is present. Apneic episodes are present. Epworth Sleepiness Score is 3.  2. Shortness of breath on exertion Sandra Henderson notes increasing shortness of breath with exercising and seems to be worsening over time with weight gain. She notes getting out of breath sooner with activity than she used to. This has not gotten worse recently. Sandra Henderson denies shortness of breath at rest or orthopnea.  3. Essential hypertension Sandra Henderson's blood pressure is well controlled today on her medications. She would like to improve with diet and weight loss.  4. Other hyperlipidemia Sandra Henderson is on statin, and she would like to improve with diet and weight loss.  5. Vitamin D deficiency Sandra Henderson's is on Vit D, and she has no recent labs in [place].  6. Depression with anxiety Sandra Henderson is on Xanax and Wellbutrin (just stopped this recently). She notes significant emotional eating in the past.  7. Hyperglycemia Sandra Henderson a history of elevated glucose readings in Epic. She denies a history of diabetes mellitus.  8. At risk for heart disease Sandra Henderson is at a higher than average risk for cardiovascular disease due to obesity. Reviewed: no chest pain on exertion, no dyspnea on exertion, and no swelling of ankles.  Assessment/Plan:   1. Other fatigue Sotiria does feel that her weight is causing her energy to be lower than it should be. Fatigue may be related to obesity, depression or many other causes. Labs will be ordered, and in the meanwhile, Saragrace will focus on self care including making healthy food choices, increasing physical activity and focusing on stress reduction.  -  EKG 12-Lead - CBC with Differential/Platelet - Folate - T3 - T4, free - TSH - Vitamin B12  2. Shortness of breath on exertion Terisa does feel that she gets out of breath more easily that she used to when she exercises. Angelia's shortness of breath appears to be obesity related and exercise induced. She has  agreed to work on weight loss and gradually increase exercise to treat her exercise induced shortness of breath. Will continue to monitor closely.  3. Essential hypertension Sandra Henderson will start her diet, and will work on healthy weight loss and exercise to improve blood pressure control. We will watch for signs of hypotension as she continues her lifestyle modifications. We will check labs today.  - Comprehensive metabolic panel  4. Other hyperlipidemia Cardiovascular risk and specific lipid/LDL goals reviewed. We discussed several lifestyle modifications today. Jaidah will start her diet, and will continue to work on exercise and weight loss efforts. We will check labs today. Orders and follow up as documented in patient record.   - Lipid Panel With LDL/HDL Ratio  5. Vitamin D deficiency Low Vitamin D level contributes to fatigue and are associated with obesity, breast, and colon cancer. We will check labs today. Sandra Henderson will follow-up for routine testing of Vitamin D, at least 2-3 times per year to avoid over-replacement.  - VITAMIN D 25 Hydroxy (Vit-D Deficiency, Fractures)  6. Depression with anxiety Behavior modification techniques were discussed today to help Sandra Henderson deal with her emotional/non-hunger eating behaviors. We will refer to Dr. Dewaine Conger, our Bariatric Psychologist Orders and follow up as documented in patient record.   7. Hyperglycemia Fasting labs will be obtained today and results with be discussed with Sandra Henderson in 2 weeks at her follow up visit. In the meanwhile Nalda was started on a lower simple carbohydrate diet and will work on weight loss efforts.  - Hemoglobin A1c - Insulin, random  8. At risk for heart disease Sandra Henderson was given approximately 30 minutes of coronary artery disease prevention counseling today. She is 59 y.o. female and has risk factors for heart disease including obesity. We discussed intensive lifestyle modifications today with an emphasis on specific  weight loss instructions and strategies.   Repetitive spaced learning was employed today to elicit superior memory formation and behavioral change.  9. Class 2 severe obesity with serious comorbidity and body mass index (BMI) of 39.0 to 39.9 in adult, unspecified obesity type Sandra Henderson) Sandra Henderson is currently in the action stage of change and her goal is to continue with weight loss efforts. I recommend Sandra Henderson begin the structured treatment plan as follows:  She has agreed to the Category 2 Plan + 100 calories.  Exercise goals: No exercise has been prescribed for now, while we concentrate on nutritional changes.   Behavioral modification strategies: increasing lean protein intake, increasing vegetables and meal planning and cooking strategies.  She was informed of the importance of frequent follow-up visits to maximize her success with intensive lifestyle modifications for her multiple health conditions. She was informed we would discuss her lab results at her next visit unless there is a critical issue that needs to be addressed sooner. Sandra Henderson agreed to keep her next visit at the agreed upon time to discuss these results.  Objective:   Blood pressure 116/74, pulse 86, temperature 98.1 F (36.7 C), temperature source Oral, height 5\' 2"  (1.575 m), weight 216 lb (98 kg), last menstrual period 06/12/2018, SpO2 97 %. Body mass index is 39.51 kg/m.  EKG: Normal sinus rhythm, rate  86 BPM.  Indirect Calorimeter completed today shows a VO2 of 238 and a REE of 1660.  Her calculated basal metabolic rate is 5277 thus her basal metabolic rate is better than expected.  General: Cooperative, alert, well developed, in no acute distress. HEENT: Conjunctivae and lids unremarkable. Cardiovascular: Regular rhythm.  Lungs: Normal work of breathing. Neurologic: No focal deficits.   Lab Results  Component Value Date   CREATININE 0.74 09/27/2014   BUN 9 09/27/2014   NA 138 09/27/2014   K 3.8 09/27/2014   CL  105 09/27/2014   CO2 26 09/27/2014   Lab Results  Component Value Date   ALT 30 09/26/2014   AST 26 09/26/2014   ALKPHOS 62 09/26/2014   BILITOT 0.7 09/26/2014   Lab Results  Component Value Date   HGBA1C 5.7 (H) 09/27/2014   No results found for: INSULIN No results found for: TSH Lab Results  Component Value Date   CHOL 188 09/27/2014   HDL 34 (L) 09/27/2014   LDLCALC 90 09/27/2014   TRIG 318 (H) 09/27/2014   CHOLHDL 5.5 09/27/2014   Lab Results  Component Value Date   WBC 8.2 09/26/2014   HGB 13.5 09/26/2014   HCT 40.7 09/26/2014   MCV 86.4 09/26/2014   PLT 443 (H) 09/26/2014   No results found for: IRON, TIBC, FERRITIN  Attestation Statements:   Reviewed by clinician on day of visit: allergies, medications, problem list, medical history, surgical history, family history, social history, and previous encounter notes.   I, Burt Knack, am acting as transcriptionist for Quillian Quince, MD.  I have reviewed the above documentation for accuracy and completeness, and I agree with the above. - Quillian Quince, MD

## 2019-07-06 ENCOUNTER — Ambulatory Visit (INDEPENDENT_AMBULATORY_CARE_PROVIDER_SITE_OTHER): Payer: BC Managed Care – PPO | Admitting: Psychology

## 2019-07-06 DIAGNOSIS — F418 Other specified anxiety disorders: Secondary | ICD-10-CM | POA: Diagnosis not present

## 2019-07-06 LAB — LIPID PANEL WITH LDL/HDL RATIO
Cholesterol, Total: 205 mg/dL — ABNORMAL HIGH (ref 100–199)
HDL: 49 mg/dL (ref >39)
LDL Chol Calc (NIH): 122 mg/dL — ABNORMAL HIGH (ref 0–99)
LDL/HDL Ratio: 2.5 ratio (ref 0.0–3.2)
Triglycerides: 194 mg/dL — ABNORMAL HIGH (ref 0–149)
VLDL Cholesterol Cal: 34 mg/dL (ref 5–40)

## 2019-07-06 LAB — COMPREHENSIVE METABOLIC PANEL
ALT: 61 IU/L — ABNORMAL HIGH (ref 0–32)
AST: 35 IU/L (ref 0–40)
Albumin/Globulin Ratio: 1.9 (ref 1.2–2.2)
Albumin: 4.9 g/dL (ref 3.8–4.9)
Alkaline Phosphatase: 85 IU/L (ref 39–117)
BUN/Creatinine Ratio: 19 (ref 12–28)
BUN: 15 mg/dL (ref 8–27)
Bilirubin Total: 0.5 mg/dL (ref 0.0–1.2)
CO2: 23 mmol/L (ref 20–29)
Calcium: 10 mg/dL (ref 8.7–10.3)
Chloride: 98 mmol/L (ref 96–106)
Creatinine, Ser: 0.77 mg/dL (ref 0.57–1.00)
GFR calc Af Amer: 97 mL/min/{1.73_m2} (ref >59)
GFR calc non Af Amer: 84 mL/min/{1.73_m2} (ref >59)
Globulin, Total: 2.6 g/dL (ref 1.5–4.5)
Glucose: 101 mg/dL — ABNORMAL HIGH (ref 65–99)
Potassium: 4.6 mmol/L (ref 3.5–5.2)
Sodium: 139 mmol/L (ref 134–144)
Total Protein: 7.5 g/dL (ref 6.0–8.5)

## 2019-07-06 LAB — CBC WITH DIFFERENTIAL/PLATELET
Basophils Absolute: 0.1 10*3/uL (ref 0.0–0.2)
Basos: 1 %
EOS (ABSOLUTE): 0.1 10*3/uL (ref 0.0–0.4)
Eos: 1 %
Hematocrit: 44.6 % (ref 34.0–46.6)
Hemoglobin: 15.5 g/dL (ref 11.1–15.9)
Immature Grans (Abs): 0.1 10*3/uL (ref 0.0–0.1)
Immature Granulocytes: 1 %
Lymphocytes Absolute: 2.1 10*3/uL (ref 0.7–3.1)
Lymphs: 27 %
MCH: 32.4 pg (ref 26.6–33.0)
MCHC: 34.8 g/dL (ref 31.5–35.7)
MCV: 93 fL (ref 79–97)
Monocytes Absolute: 0.6 10*3/uL (ref 0.1–0.9)
Monocytes: 8 %
Neutrophils Absolute: 5 10*3/uL (ref 1.4–7.0)
Neutrophils: 62 %
Platelets: 398 10*3/uL (ref 150–450)
RBC: 4.79 x10E6/uL (ref 3.77–5.28)
RDW: 11.9 % (ref 11.7–15.4)
WBC: 7.9 10*3/uL (ref 3.4–10.8)

## 2019-07-06 LAB — HEMOGLOBIN A1C
Est. average glucose Bld gHb Est-mCnc: 108 mg/dL
Hgb A1c MFr Bld: 5.4 % (ref 4.8–5.6)

## 2019-07-06 LAB — TSH: TSH: 1.38 u[IU]/mL (ref 0.450–4.500)

## 2019-07-06 LAB — VITAMIN D 25 HYDROXY (VIT D DEFICIENCY, FRACTURES): Vit D, 25-Hydroxy: 52.8 ng/mL (ref 30.0–100.0)

## 2019-07-06 LAB — INSULIN, RANDOM: INSULIN: 31.3 u[IU]/mL — ABNORMAL HIGH (ref 2.6–24.9)

## 2019-07-06 LAB — FOLATE: Folate: 15.1 ng/mL (ref >3.0)

## 2019-07-06 LAB — T3: T3, Total: 131 ng/dL (ref 71–180)

## 2019-07-06 LAB — VITAMIN B12: Vitamin B-12: 562 pg/mL (ref 232–1245)

## 2019-07-06 LAB — T4, FREE: Free T4: 1.16 ng/dL (ref 0.82–1.77)

## 2019-07-06 NOTE — Progress Notes (Signed)
  Office: [phone]  /  Fax: [phone]    Date: July 20, 2019   Appointment Start Time: 2:34pm Duration: 27 minutes Provider: Lawerance Cruel, Psy.D. Type of Session: Individual Therapy  Location of Patient: Home Location of Provider: Provider's Home Type of Contact: Telepsychological Visit via [employer]  Session Content: Fayetta is a 59 y.o. female presenting via [employer] for a follow-up appointment to address the previously established treatment goal of decreasing emotional eating. Today's appointment was a telepsychological visit due to COVID-19. Mercie provided verbal consent for today's telepsychological appointment and she is aware she is responsible for securing confidentiality on her end of the session. Prior to proceeding with today's appointment, Kerston's physical location at the time of this appointment was obtained as well a phone number she could be reached at in the event of technical difficulties. Maxwell and this provider participated in today's telepsychological service.   This provider conducted a brief check-in. Renesmae reported having a "very good experience" with the clinic and shared about progress to date. Emotional and physical hunger was provided. Psychoeducation regarding triggers for emotional eating was provided. Clotiel was provided a handout, and encouraged to utilize the handout between now and the next appointment to increase awareness of triggers and frequency. Sania agreed. This provider also discussed behavioral strategies for specific triggers, such as placing the utensil down when conversing to avoid mindless eating. Amaris provided verbal consent during today's appointment for this provider to send a handout about triggers via e-mail. Rileigh was receptive to today's appointment as evidenced by openness to sharing, responsiveness to feedback, and willingness to explore triggers for emotional eating.  Mental Status Examination:  Appearance: well groomed  and appropriate hygiene  Behavior: appropriate to circumstances Mood: euthymic Affect: mood congruent Speech: normal in rate, volume, and tone Eye Contact: appropriate Psychomotor Activity: appropriate Gait: unable to assess Thought Process: linear, logical, and goal directed  Thought Content/Perception: no hallucinations, delusions, bizarre thinking or behavior reported or observed and no evidence of suicidal and homicidal ideation, plan, and intent Orientation: time, person, place and purpose of appointment Memory/Concentration: memory, attention, language, and fund of knowledge intact  Insight/Judgment: good  Interventions:  Conducted a brief chart review Provided empathic reflections and validation Reviewed content from the previous session Employed supportive psychotherapy interventions to facilitate reduced distress and to improve coping skills with identified stressors Employed motivational interviewing skills to assess patient's willingness/desire to adhere to recommended medical treatments and assignments Psychoeducation provided regarding triggers for emotional eating  DSM-5 Diagnosis(es): 300.09 (F41.8) Other Specified Anxiety Disorder, Emotional Eating Behaviors and Limited-symptom attacks  Treatment Goal & Progress: During the initial appointment with this provider, the following treatment goal was established: decrease emotional eating. Charity has demonstrated progress in her goal as evidenced by increased awareness of hunger patterns.   Plan: Per Selenia's request, the next appointment will be scheduled in one month, which will be via [employer]. The next session will focus on working towards the established treatment goal.

## 2019-07-12 ENCOUNTER — Ambulatory Visit: Payer: BC Managed Care – PPO | Admitting: Registered"

## 2019-07-12 DIAGNOSIS — M8580 Other specified disorders of bone density and structure, unspecified site: Secondary | ICD-10-CM | POA: Diagnosis not present

## 2019-07-17 ENCOUNTER — Ambulatory Visit: Payer: BC Managed Care – PPO | Attending: Internal Medicine

## 2019-07-17 DIAGNOSIS — Z23 Encounter for immunization: Secondary | ICD-10-CM | POA: Insufficient documentation

## 2019-07-17 NOTE — Progress Notes (Signed)
   Covid-19 Vaccination Clinic  Name:  Sandra Henderson    MRN: [id number] DOB: 09/01/1960  07/17/2019  [name] was observed post Covid-19 immunization for 15 minutes without incident. She was provided with Vaccine Information Sheet and instruction to access the V-Safe system.   [name] was instructed to call 911 with any severe reactions post vaccine: Marland Kitchen Difficulty breathing  . Swelling of face and throat  . A fast heartbeat  . A bad rash all over body  . Dizziness and weakness   Immunizations Administered    Name Date Dose VIS Date Route   Pfizer COVID-19 Vaccine 07/17/2019  1:32 PM 0.3 mL 04/22/2019 Intramuscular   Manufacturer: [employer], [employer]   Lot: [id number]   NDC: [address]-4

## 2019-07-19 ENCOUNTER — Other Ambulatory Visit: Payer: Self-pay

## 2019-07-19 ENCOUNTER — Ambulatory Visit (INDEPENDENT_AMBULATORY_CARE_PROVIDER_SITE_OTHER): Payer: BC Managed Care – PPO | Admitting: Family Medicine

## 2019-07-19 ENCOUNTER — Other Ambulatory Visit (INDEPENDENT_AMBULATORY_CARE_PROVIDER_SITE_OTHER): Payer: Self-pay | Admitting: Family Medicine

## 2019-07-19 ENCOUNTER — Encounter (INDEPENDENT_AMBULATORY_CARE_PROVIDER_SITE_OTHER): Payer: Self-pay | Admitting: Family Medicine

## 2019-07-19 VITALS — BP 131/77 | HR 84 | Temp 98.8°F | Ht 62.0 in | Wt 213.0 lb

## 2019-07-19 DIAGNOSIS — R7303 Prediabetes: Secondary | ICD-10-CM

## 2019-07-19 DIAGNOSIS — Z9189 Other specified personal risk factors, not elsewhere classified: Secondary | ICD-10-CM | POA: Diagnosis not present

## 2019-07-19 DIAGNOSIS — Z6839 Body mass index (BMI) 39.0-39.9, adult: Secondary | ICD-10-CM

## 2019-07-19 DIAGNOSIS — E559 Vitamin D deficiency, unspecified: Secondary | ICD-10-CM

## 2019-07-19 DIAGNOSIS — K760 Fatty (change of) liver, not elsewhere classified: Secondary | ICD-10-CM

## 2019-07-19 MED ORDER — METFORMIN HCL 500 MG PO TABS: 500.0000 mg | ORAL_TABLET | Freq: Every morning | ORAL | 0 refills | Status: DC

## 2019-07-19 MED ORDER — VITAMIN D (ERGOCALCIFEROL) 1.25 MG (50000 UNIT) PO CAPS: 50000.0000 [IU] | ORAL_CAPSULE | ORAL | 0 refills | Status: DC

## 2019-07-19 NOTE — Progress Notes (Signed)
Chief Complaint:   OBESITY Sandra Henderson is here to discuss her progress with her obesity treatment plan along with follow-up of her obesity related diagnoses. Sandra Henderson is on the Category 2 Plan + 100 calories and states she is following her eating plan approximately 90% of the time. Sandra Henderson states she is walking 5,000-8,000 steps 7 times per week.  Today's visit was #: 2 Starting weight: 216 lbs Starting date: 07/05/2019 Today's weight: 213 lbs Today's date: 07/19/2019 Total lbs lost to date: 3 Total lbs lost since last in-office visit: 3  Interim History: Sandra Henderson did well with her Category 2 plan. Her hunger was mostly controlled, and she did well with meal planning. She was a bit bored with breakfast options.  Subjective:   1. Non-alcoholic fatty liver disease Sandra Henderson has a new diagnosis of non alcoholic fatty liver disease. She has mildly elevated ALT and central adiposity making NAFLD likely. She denies jaundice or abdominal pain. I discussed labs with the patient today.  2. Vitamin D deficiency Sandra Henderson is on Vit D prescription, but she is almost out. Her level is now at goal. I discussed labs with the patient today.  3. Pre-diabetes Sandra Henderson has a new diagnosis pre-diabetes. She has a mildly elevated fasting glucose, and her fasting insulin is elevated as well but she a normal A1c. She notes polyphagia. I discussed labs with the patient today.  4. At risk for diabetes mellitus Sandra Henderson is at higher than average risk for developing diabetes due to her obesity.   Assessment/Plan:   1. Non-alcoholic fatty liver disease We discussed the likely diagnosis of non-alcoholic fatty liver disease today and how this condition is obesity related. Sandra Henderson was educated the importance of weight loss. Sandra Henderson agreed to continue with her weight loss efforts with healthier diet and exercise as an essential part of her treatment plan. We will check labs in 3 months, if no improvement we will investigate  further.  2. Vitamin D deficiency Low Vitamin D level contributes to fatigue and are associated with obesity, breast, and colon cancer. We will refill prescription Vitamin D for 1 month. Sandra Henderson will follow-up for routine testing of Vitamin D, at least 2-3 times per year to avoid over-replacement. We will recheck labs in 3 months.  - Vitamin D, Ergocalciferol, (DRISDOL) 1.25 MG (50000 UNIT) CAPS capsule; Take 1 capsule (50,000 Units total) by mouth every 7 (seven) days.  Dispense: 4 capsule; Refill: 0  3. Pre-diabetes Sandra Henderson will continue to work on weight loss, diet, exercise, and decreasing simple carbohydrates to help decrease the risk of diabetes. Sandra Henderson agreed to start metformin 500 mg daily with no refills. We will recheck labs in 3 months.  - metFORMIN (GLUCOPHAGE) 500 MG tablet; Take 1 tablet (500 mg total) by mouth every morning.  Dispense: 30 tablet; Refill: 0  4. At risk for diabetes mellitus Sandra Henderson was given approximately 30 minutes of diabetes education and counseling today. We discussed intensive lifestyle modifications today with an emphasis on weight loss as well as increasing exercise and decreasing simple carbohydrates in her diet. We also reviewed medication options with an emphasis on risk versus benefit of those discussed.   Repetitive spaced learning was employed today to elicit superior memory formation and behavioral change.  5. Class 2 severe obesity with serious comorbidity and body mass index (BMI) of 39.0 to 39.9 in adult, unspecified obesity type Sandra Henderson) Sandra Henderson is currently in the action stage of change. As such, her goal is to continue with weight loss  efforts. She has agreed to the Category 2 Plan + 100 calories with breakfast options.   Exercise goals: As is.  Behavioral modification strategies: increasing lean protein intake and meal planning and cooking strategies.  Sandra Henderson has agreed to follow-up with our clinic in 2 weeks. She was informed of the importance of  frequent follow-up visits to maximize her success with intensive lifestyle modifications for her multiple health conditions.   Objective:   Blood pressure 131/77, pulse 84, temperature 98.8 F (37.1 C), temperature source Oral, height 5\' 2"  (1.575 m), weight 213 lb (96.6 kg), SpO2 98 %. Body mass index is 38.96 kg/m.  General: Cooperative, alert, well developed, in no acute distress. HEENT: Conjunctivae and lids unremarkable. Cardiovascular: Regular rhythm.  Lungs: Normal work of breathing. Neurologic: No focal deficits.   Lab Results  Component Value Date   CREATININE 0.77 07/05/2019   BUN 15 07/05/2019   NA 139 07/05/2019   K 4.6 07/05/2019   CL 98 07/05/2019   CO2 23 07/05/2019   Lab Results  Component Value Date   ALT 61 (H) 07/05/2019   AST 35 07/05/2019   ALKPHOS 85 07/05/2019   BILITOT 0.5 07/05/2019   Lab Results  Component Value Date   HGBA1C 5.4 07/05/2019   HGBA1C 5.7 (H) 09/27/2014   Lab Results  Component Value Date   INSULIN 31.3 (H) 07/05/2019   Lab Results  Component Value Date   TSH 1.380 07/05/2019   Lab Results  Component Value Date   CHOL 205 (H) 07/05/2019   HDL 49 07/05/2019   LDLCALC 122 (H) 07/05/2019   TRIG 194 (H) 07/05/2019   CHOLHDL 5.5 09/27/2014   Lab Results  Component Value Date   WBC 7.9 07/05/2019   HGB 15.5 07/05/2019   HCT 44.6 07/05/2019   MCV 93 07/05/2019   PLT 398 07/05/2019   No results found for: IRON, TIBC, FERRITIN  Attestation Statements:   Reviewed by clinician on day of visit: allergies, medications, problem list, medical history, surgical history, family history, social history, and previous encounter notes.   I, 07/07/2019, am acting as transcriptionist for Burt Knack, MD.  I have reviewed the above documentation for accuracy and completeness, and I agree with the above. -  Quillian Quince, MD

## 2019-07-20 ENCOUNTER — Ambulatory Visit (INDEPENDENT_AMBULATORY_CARE_PROVIDER_SITE_OTHER): Payer: BC Managed Care – PPO | Admitting: Psychology

## 2019-07-20 DIAGNOSIS — F418 Other specified anxiety disorders: Secondary | ICD-10-CM

## 2019-07-20 DIAGNOSIS — F5089 Other specified eating disorder: Secondary | ICD-10-CM

## 2019-08-03 ENCOUNTER — Encounter (INDEPENDENT_AMBULATORY_CARE_PROVIDER_SITE_OTHER): Payer: Self-pay | Admitting: Family Medicine

## 2019-08-03 ENCOUNTER — Other Ambulatory Visit: Payer: Self-pay

## 2019-08-03 ENCOUNTER — Ambulatory Visit (INDEPENDENT_AMBULATORY_CARE_PROVIDER_SITE_OTHER): Payer: BC Managed Care – PPO | Admitting: Family Medicine

## 2019-08-03 VITALS — BP 116/76 | HR 86 | Temp 98.3°F | Ht 62.0 in | Wt 209.0 lb

## 2019-08-03 DIAGNOSIS — R7303 Prediabetes: Secondary | ICD-10-CM | POA: Diagnosis not present

## 2019-08-03 DIAGNOSIS — Z9189 Other specified personal risk factors, not elsewhere classified: Secondary | ICD-10-CM | POA: Diagnosis not present

## 2019-08-03 DIAGNOSIS — E559 Vitamin D deficiency, unspecified: Secondary | ICD-10-CM

## 2019-08-03 DIAGNOSIS — Z6838 Body mass index (BMI) 38.0-38.9, adult: Secondary | ICD-10-CM

## 2019-08-03 MED ORDER — METFORMIN HCL 500 MG PO TABS: 500.0000 mg | ORAL_TABLET | Freq: Every morning | ORAL | 0 refills | Status: DC

## 2019-08-03 MED ORDER — VITAMIN D (ERGOCALCIFEROL) 1.25 MG (50000 UNIT) PO CAPS: 50000.0000 [IU] | ORAL_CAPSULE | ORAL | 0 refills | Status: DC

## 2019-08-03 NOTE — Progress Notes (Unsigned)
Office: [phone]  /  Fax: [phone]    Date: August 17, 2019   Appointment Start Time: *** Duration: *** minutes Provider: Lawerance Cruel, Psy.D. Type of Session: Individual Therapy  Location of Patient: {gbptloc:23249} Location of Provider: {Location of Service:22491} Type of Contact: Telepsychological Visit via {gbtelepsych:23399}  Session Content: Sandra Henderson is a 59 y.o. female presenting via {gbtelepsych:23399} for a follow-up appointment to address the previously established treatment goal of decreasing emotional eating. Today's appointment was a telepsychological visit due to COVID-19. [name] provided verbal consent for today's telepsychological appointment and she is aware she is responsible for securing confidentiality on her end of the session. Prior to proceeding with today's appointment, [name]'s physical location at the time of this appointment was obtained as well a phone number she could be reached at in the event of technical difficulties. [name] and this provider participated in today's telepsychological service.   This provider conducted a brief check-in and verbally administered the PHQ-9 and GAD-7. *** [name] was receptive to today's appointment as evidenced by openness to sharing, responsiveness to feedback, and {gbreceptiveness:23401}.  Mental Status Examination:  Appearance: {Appearance:22431} Behavior: {Behavior:22445} Mood: {gbmood:21757} Affect: {Affect:22436} Speech: {Speech:22432} Eye Contact: {Eye Contact:22433} Psychomotor Activity: {Motor Activity:22434} Gait: {gbgait:23404} Thought Process: {thought process:22448}  Thought Content/Perception: {disturbances:22451} Orientation: {Orientation:22437} Memory/Concentration: {gbcognition:22449} Insight/Judgment: {Insight:22446}  Structured Assessments Results: The Patient Health Questionnaire-9 (PHQ-9) is a self-report measure that assesses symptoms and severity of depression over the course of the last two  weeks. [name] obtained a score of *** suggesting {GBPHQ9SEVERITY:21752}. [name] finds the endorsed symptoms to be {gbphq9difficulty:21754}. [0= Not at all; 1= Several days; 2= More than half the days; 3= Nearly every day] Little interest or pleasure in doing things ***  Feeling down, depressed, or hopeless ***  Trouble falling or staying asleep, or sleeping too much ***  Feeling tired or having little energy ***  Poor appetite or overeating ***  Feeling bad about yourself --- or that you are a failure or have let yourself or your family down ***  Trouble concentrating on things, such as reading the newspaper or watching television ***  Moving or speaking so slowly that other people could have noticed? Or the opposite --- being so fidgety or restless that you have been moving around a lot more than usual ***  Thoughts that you would be better off dead or hurting yourself in some way ***  PHQ-9 Score ***    The Generalized Anxiety Disorder-7 (GAD-7) is a brief self-report measure that assesses symptoms of anxiety over the course of the last two weeks. [name] obtained a score of *** suggesting {gbgad7severity:21753}. [name] finds the endorsed symptoms to be {gbphq9difficulty:21754}. [0= Not at all; 1= Several days; 2= Over half the days; 3= Nearly every day] Feeling nervous, anxious, on edge ***  Not being able to stop or control worrying ***  Worrying too much about different things ***  Trouble relaxing ***  Being so restless that it's hard to sit still ***  Becoming easily annoyed or irritable ***  Feeling afraid as if something awful might happen ***  GAD-7 Score ***   Interventions:  {Interventions for Progress Notes:23405}  DSM-5 Diagnosis(es): 300.09 (F41.8) Other Specified Anxiety Disorder, Emotional Eating Behaviors and Limited-symptom attacks  Treatment Goal & Progress: During the initial appointment with this provider, the following treatment goal was established: decrease  emotional eating. [name] has demonstrated progress in her goal as evidenced by {gbtxprogress:22839}. [name] also {gbtxprogress2:22951}.  Plan: The next appointment will be scheduled in {  gbweeks:21758}, which will be {gbtxmodality:23402}. The next session will focus on {Plan for Next Appointment:23400}.

## 2019-08-04 NOTE — Progress Notes (Signed)
Chief Complaint:   OBESITY Sandra Henderson is here to discuss her progress with her obesity treatment plan along with follow-up of her obesity related diagnoses. Sandra Henderson is on the Category 2 Plan + 100 calories and states she is following her eating plan approximately 85-90% of the time. Sandra Henderson states she is walking and more mindful of her steps (6,000-8,000).  Today's visit was #: 3 Starting weight: 216 lbs Starting date: 07/05/2019 Today's weight: 209 lbs Today's date: 08/03/2019 Total lbs lost to date: 7 Total lbs lost since last in-office visit: 4  Interim History: Sandra Henderson was able to celebrate [place] Patrick[place] Day without deprivation, however she was able to enjoy some of the traditional holiday foods. We discussed strategies on how to eat out and follow the meal plan as much as possible. She reports increased constipation the last 1.5 weeks. I recommended her to increase fiber in her diet. She is using OTC benefiber.   Subjective:   1. Vitamin D deficiency Sandra Henderson's Vit D level was 52.8 on 07/05/2019. She is currently on prescription Vit D supplementation.  2. Pre-diabetes Sandra Henderson has had elevated A1c in the past, and recently elevated fasting insulin. She is tolerating metformin 500 mg q daily, and denies GI upset.  3. At risk for heart disease Sandra Henderson is at a higher than average risk for cardiovascular disease due to hypertension and pre-diabetes. Reviewed: no chest pain on exertion, no dyspnea on exertion, and no swelling of ankles.  Assessment/Plan:   1. Vitamin D deficiency Low Vitamin D level contributes to fatigue and are associated with obesity, breast, and colon cancer. We will refill prescription Vitamin D for 1 month. Sandra Henderson will follow-up for routine testing of Vitamin D, at least 2-3 times per year to avoid over-replacement. We will recheck labs in 2 months.  - Vitamin D, Ergocalciferol, (DRISDOL) 1.25 MG (50000 UNIT) CAPS capsule; Take 1 capsule (50,000 Units total) by mouth  every 7 (seven) days.  Dispense: 4 capsule; Refill: 0  2. Pre-diabetes Sandra Henderson will continue her Category 2 meal plan, and will continue to work on weight loss, exercise, and decreasing simple carbohydrates to help decrease the risk of diabetes. We will refill metformin for 1 month. We will recheck labs in 2 months.  - metFORMIN (GLUCOPHAGE) 500 MG tablet; Take 1 tablet (500 mg total) by mouth every morning.  Dispense: 30 tablet; Refill: 0  3. At risk for heart disease Sandra Henderson was given approximately 15 minutes of coronary artery disease prevention counseling today. She is 59 y.o. female and has risk factors for heart disease including obesity, hypertension, and pre-diabetes. We discussed intensive lifestyle modifications today with an emphasis on specific weight loss instructions and strategies.   Repetitive spaced learning was employed today to elicit superior memory formation and behavioral change.  4. Class 2 severe obesity with serious comorbidity and body mass index (BMI) of 38.0 to 38.9 in adult, unspecified obesity type Sandra Henderson) Sandra Henderson is currently in the action stage of change. As such, her goal is to continue with weight loss efforts. She has agreed to the Category 2 Plan.   Exercise goals: As is.  Behavioral modification strategies: increasing high fiber foods, holiday eating strategies  and celebration eating strategies.  Sandra Henderson has agreed to follow-up with our clinic in 2 weeks. She was informed of the importance of frequent follow-up visits to maximize her success with intensive lifestyle modifications for her multiple health conditions.   Objective:   Blood pressure 116/76, pulse 86, temperature 98.3 F (  36.8 C), temperature source Oral, height 5\' 2"  (1.575 m), weight 209 lb (94.8 kg), last menstrual period 06/12/2018, SpO2 97 %. Body mass index is 38.23 kg/m.  General: Cooperative, alert, well developed, in no acute distress. HEENT: Conjunctivae and lids  unremarkable. Cardiovascular: Regular rhythm.  Lungs: Normal work of breathing. Neurologic: No focal deficits.   Lab Results  Component Value Date   CREATININE 0.77 07/05/2019   BUN 15 07/05/2019   NA 139 07/05/2019   K 4.6 07/05/2019   CL 98 07/05/2019   CO2 23 07/05/2019   Lab Results  Component Value Date   ALT 61 (H) 07/05/2019   AST 35 07/05/2019   ALKPHOS 85 07/05/2019   BILITOT 0.5 07/05/2019   Lab Results  Component Value Date   HGBA1C 5.4 07/05/2019   HGBA1C 5.7 (H) 09/27/2014   Lab Results  Component Value Date   INSULIN 31.3 (H) 07/05/2019   Lab Results  Component Value Date   TSH 1.380 07/05/2019   Lab Results  Component Value Date   CHOL 205 (H) 07/05/2019   HDL 49 07/05/2019   LDLCALC 122 (H) 07/05/2019   TRIG 194 (H) 07/05/2019   CHOLHDL 5.5 09/27/2014   Lab Results  Component Value Date   WBC 7.9 07/05/2019   HGB 15.5 07/05/2019   HCT 44.6 07/05/2019   MCV 93 07/05/2019   PLT 398 07/05/2019   No results found for: IRON, TIBC, FERRITIN  Attestation Statements:   Reviewed by clinician on day of visit: allergies, medications, problem list, medical history, surgical history, family history, social history, and previous encounter notes.   I, Trixie Dredge, am acting as transcriptionist for Dennard Nip, MD.  I have reviewed the above documentation for accuracy and completeness, and I agree with the above. -  Dennard Nip, MD

## 2019-08-09 ENCOUNTER — Other Ambulatory Visit (INDEPENDENT_AMBULATORY_CARE_PROVIDER_SITE_OTHER): Payer: Self-pay | Admitting: Family Medicine

## 2019-08-09 DIAGNOSIS — E559 Vitamin D deficiency, unspecified: Secondary | ICD-10-CM

## 2019-08-11 ENCOUNTER — Other Ambulatory Visit (INDEPENDENT_AMBULATORY_CARE_PROVIDER_SITE_OTHER): Payer: Self-pay | Admitting: Family Medicine

## 2019-08-11 DIAGNOSIS — R7303 Prediabetes: Secondary | ICD-10-CM

## 2019-08-17 ENCOUNTER — Ambulatory Visit (INDEPENDENT_AMBULATORY_CARE_PROVIDER_SITE_OTHER): Payer: BC Managed Care – PPO | Admitting: Family Medicine

## 2019-08-17 ENCOUNTER — Other Ambulatory Visit: Payer: Self-pay

## 2019-08-17 ENCOUNTER — Encounter (INDEPENDENT_AMBULATORY_CARE_PROVIDER_SITE_OTHER): Payer: Self-pay | Admitting: Family Medicine

## 2019-08-17 ENCOUNTER — Ambulatory Visit: Payer: BC Managed Care – PPO | Attending: Internal Medicine

## 2019-08-17 ENCOUNTER — Ambulatory Visit (INDEPENDENT_AMBULATORY_CARE_PROVIDER_SITE_OTHER): Payer: Self-pay | Admitting: Psychology

## 2019-08-17 VITALS — BP 132/92 | HR 85 | Temp 98.4°F | Ht 62.0 in | Wt 208.0 lb

## 2019-08-17 DIAGNOSIS — E8881 Metabolic syndrome: Secondary | ICD-10-CM | POA: Diagnosis not present

## 2019-08-17 DIAGNOSIS — K5900 Constipation, unspecified: Secondary | ICD-10-CM

## 2019-08-17 DIAGNOSIS — Z23 Encounter for immunization: Secondary | ICD-10-CM

## 2019-08-17 DIAGNOSIS — Z6838 Body mass index (BMI) 38.0-38.9, adult: Secondary | ICD-10-CM

## 2019-08-17 DIAGNOSIS — M25562 Pain in left knee: Secondary | ICD-10-CM | POA: Diagnosis not present

## 2019-08-17 NOTE — Progress Notes (Signed)
   Covid-19 Vaccination Clinic  Name:  Sandra Henderson    MRN: [id number] DOB: 22-Sep-1960  08/17/2019  [name] was observed post Covid-19 immunization for 15 minutes without incident. She was provided with Vaccine Information Sheet and instruction to access the V-Safe system.   [name] was instructed to call 911 with any severe reactions post vaccine: Marland Kitchen Difficulty breathing  . Swelling of face and throat  . A fast heartbeat  . A bad rash all over body  . Dizziness and weakness   Immunizations Administered    Name Date Dose VIS Date Route   Pfizer COVID-19 Vaccine 08/17/2019  8:40 AM 0.3 mL 04/22/2019 Intramuscular   Manufacturer: [employer], [employer]   Lot: [id number]   NDC: [address]-4

## 2019-08-18 NOTE — Progress Notes (Signed)
Chief Complaint:   OBESITY Umeka is here to discuss her progress with her obesity treatment plan along with follow-up of her obesity related diagnoses. Shanell is on the Category 2 Plan and states she is following her eating plan approximately 85% of the time. Karisha states she is walking for 6,000-8,000 steps 7 times per week.  Today's visit was #: 4 Starting weight: 216 lbs Starting date: 07/05/2019 Today's weight: 208 lbs Today's date: 08/17/2019 Total lbs lost to date: 8 Total lbs lost since last in-office visit: 1  Interim History: Trinidad has been consuming more beef and less chicken and fish. She has been counting her calories less closely and snack calories. She has been experiencing left knee pain and edema. She has continues to experience constipation, having BMs every other day which her usual is 1-3 BMs per day.  Subjective:   1. Insulin resistance Purva's insulin level on 07/05/2019 was 31.3. She was started on metformin, and she is experiencing constipation. She denies nausea or diarrhea.  2. Constipation, unspecified constipation type Pressley's constipation started after metformin. She denies abdominal pain.  3. Left knee pain, unspecified chronicity Shalisha notes left knee pain after walking on [employer] at a faster pace. Edema and pain of the left knee has occurred in the past. She has a history of RA since the age of 42. She is treated by Orthopedic specialist last year for similar symptoms.  Assessment/Plan:   1. Insulin resistance Kalkidan will continue to work on weight loss, exercise, and decreasing simple carbohydrates to help decrease the risk of diabetes. Camary agreed to continue metformin, and she agreed to follow-up with [place] as directed to closely monitor her progress. We will recheck labs in May 2021.  2. Constipation, unspecified constipation type Kelce was informed that a decrease in bowel movement frequency is normal while losing weight, but stools  should not be hard or painful. Rayleigh is to increase her water intake, and will start OTC miralax. Orders and follow up as documented in patient record.   3. Left knee pain, unspecified chronicity Myla will start OTC diclofenac topical.   4. Class 2 severe obesity with serious comorbidity and body mass index (BMI) of 38.0 to 38.9 in adult, unspecified obesity type [facility]) Collen is currently in the action stage of change. As such, her goal is to continue with weight loss efforts. She has agreed to the Category 2 Plan + 100 snack calories.   Exercise goals: As is.  Behavioral modification strategies: increasing lean protein intake and no skipping meals.  Jolanta has agreed to follow-up with our clinic in 3 weeks. She was informed of the importance of frequent follow-up visits to maximize her success with intensive lifestyle modifications for her multiple health conditions.   Objective:   Blood pressure (!) 132/92, pulse 85, temperature 98.4 F (36.9 C), temperature source Oral, height 5\' 2"  (1.575 m), weight 208 lb (94.3 kg), last menstrual period 06/12/2018, SpO2 96 %. Body mass index is 38.04 kg/m.  General: Cooperative, alert, well developed, in no acute distress. HEENT: Conjunctivae and lids unremarkable. Cardiovascular: Regular rhythm.  Lungs: Normal work of breathing. Neurologic: No focal deficits.   Lab Results  Component Value Date   CREATININE 0.77 07/05/2019   BUN 15 07/05/2019   NA 139 07/05/2019   K 4.6 07/05/2019   CL 98 07/05/2019   CO2 23 07/05/2019   Lab Results  Component Value Date   ALT 61 (H) 07/05/2019   AST 35  07/05/2019   ALKPHOS 85 07/05/2019   BILITOT 0.5 07/05/2019   Lab Results  Component Value Date   HGBA1C 5.4 07/05/2019   HGBA1C 5.7 (H) 09/27/2014   Lab Results  Component Value Date   INSULIN 31.3 (H) 07/05/2019   Lab Results  Component Value Date   TSH 1.380 07/05/2019   Lab Results  Component Value Date   CHOL 205 (H) 07/05/2019    HDL 49 07/05/2019   LDLCALC 122 (H) 07/05/2019   TRIG 194 (H) 07/05/2019   CHOLHDL 5.5 09/27/2014   Lab Results  Component Value Date   WBC 7.9 07/05/2019   HGB 15.5 07/05/2019   HCT 44.6 07/05/2019   MCV 93 07/05/2019   PLT 398 07/05/2019   No results found for: IRON, TIBC, FERRITIN  Attestation Statements:   Reviewed by clinician on day of visit: allergies, medications, problem list, medical history, surgical history, family history, social history, and previous encounter notes.  Time spent on visit including pre-visit chart review and post-visit care and charting was 23 minutes.    I, Trixie Dredge, am acting as transcriptionist for Dennard Nip, MD.  I have reviewed the above documentation for accuracy and completeness, and I agree with the above. -  Dennard Nip, MD

## 2019-09-04 ENCOUNTER — Other Ambulatory Visit (INDEPENDENT_AMBULATORY_CARE_PROVIDER_SITE_OTHER): Payer: Self-pay | Admitting: Family Medicine

## 2019-09-04 DIAGNOSIS — E559 Vitamin D deficiency, unspecified: Secondary | ICD-10-CM

## 2019-09-07 ENCOUNTER — Other Ambulatory Visit: Payer: Self-pay

## 2019-09-07 ENCOUNTER — Encounter (INDEPENDENT_AMBULATORY_CARE_PROVIDER_SITE_OTHER): Payer: Self-pay | Admitting: Family Medicine

## 2019-09-07 ENCOUNTER — Ambulatory Visit (INDEPENDENT_AMBULATORY_CARE_PROVIDER_SITE_OTHER): Payer: BC Managed Care – PPO | Admitting: Family Medicine

## 2019-09-07 VITALS — BP 145/87 | HR 105 | Temp 98.4°F | Ht 62.0 in | Wt 204.0 lb

## 2019-09-07 DIAGNOSIS — Z9189 Other specified personal risk factors, not elsewhere classified: Secondary | ICD-10-CM

## 2019-09-07 DIAGNOSIS — R7303 Prediabetes: Secondary | ICD-10-CM

## 2019-09-07 DIAGNOSIS — Z6837 Body mass index (BMI) 37.0-37.9, adult: Secondary | ICD-10-CM | POA: Diagnosis not present

## 2019-09-07 MED ORDER — METFORMIN HCL 500 MG PO TABS: 500.0000 mg | ORAL_TABLET | Freq: Every morning | ORAL | 0 refills | Status: DC

## 2019-09-08 NOTE — Progress Notes (Signed)
Chief Complaint:   OBESITY Sandra Henderson is here to discuss her progress with her obesity treatment plan along with follow-up of her obesity related diagnoses. Sandra Henderson is on the Category 2 Plan + 100 calories and states she is following her eating plan approximately 85% of the time. Sandra Henderson states she is walking 3,000 steps 7 times per week.  Today's visit was #: 5 Starting weight: 216 lbs Starting date: 07/05/2019 Today's weight: 204 lbs Today's date: 09/07/2019 Total lbs lost to date: 12 Total lbs lost since last in-office visit: 4  Interim History: Shalae continues to do well on her plan. She struggles a little with snacking but her hunger is mostly controlled. She is recovering from a knee injury but she is carefully starting to increase walking.  Subjective:   1. Pre-diabetes Sandra Henderson is stable on metformin, and she denies nausea, vomiting, or hypoglycemia. She is doing very well on her diet prescription.  2. At risk for diabetes mellitus Sandra Henderson is at higher than average risk for developing diabetes due to her obesity.   Assessment/Plan:   1. Pre-diabetes Sandra Henderson will continue to work on weight loss, exercise, and decreasing simple carbohydrates to help decrease the risk of diabetes. We will refill metformin for 1 month.   - metFORMIN (GLUCOPHAGE) 500 MG tablet; Take 1 tablet (500 mg total) by mouth every morning.  Dispense: 30 tablet; Refill: 0  2. At risk for diabetes mellitus Sandra Henderson was given approximately 15 minutes of diabetes education and counseling today. We discussed intensive lifestyle modifications today with an emphasis on weight loss as well as increasing exercise and decreasing simple carbohydrates in her diet. We also reviewed medication options with an emphasis on risk versus benefit of those discussed.   Repetitive spaced learning was employed today to elicit superior memory formation and behavioral change.  3. Class 2 severe obesity with serious comorbidity and  body mass index (BMI) of 37.0 to 37.9 in adult, unspecified obesity type [facility]) Sandra Henderson is currently in the action stage of change. As such, her goal is to continue with weight loss efforts. She has agreed to the Category 2 Plan.   Exercise goals: As is.  Behavioral modification strategies: decreasing eating out.  Sandra Henderson has agreed to follow-up with our clinic in 2 to 3 weeks. She was informed of the importance of frequent follow-up visits to maximize her success with intensive lifestyle modifications for her multiple health conditions.   Objective:   Blood pressure (!) 145/87, pulse (!) 105, temperature 98.4 F (36.9 C), temperature source Oral, height 5\' 2"  (1.575 m), weight 204 lb (92.5 kg), last menstrual period 06/12/2018, SpO2 97 %. Body mass index is 37.31 kg/m.  General: Cooperative, alert, well developed, in no acute distress. HEENT: Conjunctivae and lids unremarkable. Cardiovascular: Regular rhythm.  Lungs: Normal work of breathing. Neurologic: No focal deficits.   Lab Results  Component Value Date   CREATININE 0.77 07/05/2019   BUN 15 07/05/2019   NA 139 07/05/2019   K 4.6 07/05/2019   CL 98 07/05/2019   CO2 23 07/05/2019   Lab Results  Component Value Date   ALT 61 (H) 07/05/2019   AST 35 07/05/2019   ALKPHOS 85 07/05/2019   BILITOT 0.5 07/05/2019   Lab Results  Component Value Date   HGBA1C 5.4 07/05/2019   HGBA1C 5.7 (H) 09/27/2014   Lab Results  Component Value Date   INSULIN 31.3 (H) 07/05/2019   Lab Results  Component Value Date   TSH 1.380  07/05/2019   Lab Results  Component Value Date   CHOL 205 (H) 07/05/2019   HDL 49 07/05/2019   LDLCALC 122 (H) 07/05/2019   TRIG 194 (H) 07/05/2019   CHOLHDL 5.5 09/27/2014   Lab Results  Component Value Date   WBC 7.9 07/05/2019   HGB 15.5 07/05/2019   HCT 44.6 07/05/2019   MCV 93 07/05/2019   PLT 398 07/05/2019   No results found for: IRON, TIBC, FERRITIN  Attestation Statements:   Reviewed by  clinician on day of visit: allergies, medications, problem list, medical history, surgical history, family history, social history, and previous encounter notes.   I, Burt Knack, am acting as transcriptionist for Quillian Quince, MD.  I have reviewed the above documentation for accuracy and completeness, and I agree with the above. -  Quillian Quince, MD

## 2019-09-09 ENCOUNTER — Other Ambulatory Visit (INDEPENDENT_AMBULATORY_CARE_PROVIDER_SITE_OTHER): Payer: Self-pay | Admitting: Family Medicine

## 2019-09-09 DIAGNOSIS — R7303 Prediabetes: Secondary | ICD-10-CM

## 2019-09-21 DIAGNOSIS — M25562 Pain in left knee: Secondary | ICD-10-CM | POA: Diagnosis not present

## 2019-09-26 ENCOUNTER — Other Ambulatory Visit: Payer: Self-pay

## 2019-09-26 ENCOUNTER — Encounter (INDEPENDENT_AMBULATORY_CARE_PROVIDER_SITE_OTHER): Payer: Self-pay | Admitting: Family Medicine

## 2019-09-26 ENCOUNTER — Other Ambulatory Visit (INDEPENDENT_AMBULATORY_CARE_PROVIDER_SITE_OTHER): Payer: Self-pay | Admitting: Family Medicine

## 2019-09-26 ENCOUNTER — Ambulatory Visit (INDEPENDENT_AMBULATORY_CARE_PROVIDER_SITE_OTHER): Payer: BC Managed Care – PPO | Admitting: Family Medicine

## 2019-09-26 VITALS — BP 127/79 | HR 85 | Temp 98.1°F | Ht 62.0 in | Wt 202.0 lb

## 2019-09-26 DIAGNOSIS — M25562 Pain in left knee: Secondary | ICD-10-CM

## 2019-09-26 DIAGNOSIS — E559 Vitamin D deficiency, unspecified: Secondary | ICD-10-CM

## 2019-09-26 DIAGNOSIS — Z6837 Body mass index (BMI) 37.0-37.9, adult: Secondary | ICD-10-CM

## 2019-09-26 DIAGNOSIS — R7303 Prediabetes: Secondary | ICD-10-CM | POA: Diagnosis not present

## 2019-09-26 DIAGNOSIS — Z9189 Other specified personal risk factors, not elsewhere classified: Secondary | ICD-10-CM | POA: Diagnosis not present

## 2019-09-26 MED ORDER — VITAMIN D (ERGOCALCIFEROL) 1.25 MG (50000 UNIT) PO CAPS: 50000.0000 [IU] | ORAL_CAPSULE | ORAL | 0 refills | Status: DC

## 2019-09-26 NOTE — Progress Notes (Signed)
Chief Complaint:   OBESITY Sandra Henderson is here to discuss her progress with her obesity treatment plan along with follow-up of her obesity related diagnoses. Sandra Henderson is on the Category 2 Plan and states she is following her eating plan approximately 80% of the time. Sandra Henderson states she is walking 5,000 steps 5 times per week.  Today's visit was #: 6 Starting weight: 216 lbs Starting date: 07/05/2019 Today's weight: 202 lbs Today's date: 09/26/2019 Total lbs lost to date: 14 Total lbs lost since last in-office visit: 2  Interim History: Sandra Henderson recently celebrated Mother's Day, family reunion, and a friend's pig pickin'. She reports making healthier food choices at these events and kept her carbohydrate and sugar intake to a minimum.  Subjective:   1. Vitamin D deficiency Charde's Vit D level on 07/05/2019 was 52.8. She is on prescription strength Vit D supplementation.  2. Left knee pain, unspecified chronicity Mida's left knee injury occurred in April 2021. She notes intermittent pain and swelling since then. She has a MRI of the left knee scheduled this week.  3. Pre-iabetes Sandra Henderson's A1c on 07/05/2019 was 5.4 and insulin level of 31.3. She is taking metformin 500 mg q daily, and she notes GI upset has resolved.  4. At risk for activity intolerance Sandra Henderson is at risk for exercise intolerance due to left knee pain.  Assessment/Plan:   1. Vitamin D deficiency Low Vitamin D level contributes to fatigue and are associated with obesity, breast, and colon cancer. We will refill prescription Vitamin D for 1 month. Sandra Henderson will follow-up for routine testing of Vitamin D, at least 2-3 times per year to avoid over-replacement. We will recheck labs at her next office visit.  - Vitamin D, Ergocalciferol, (DRISDOL) 1.25 MG (50000 UNIT) CAPS capsule; Take 1 capsule (50,000 Units total) by mouth every 7 (seven) days.  Dispense: 4 capsule; Refill: 0  2. Left knee pain, unspecified chronicity Sandra Henderson  is to keep her imaging appointment this week, and will follow up with orthopedic specialist as directed.  3. Pre-diabetes Sandra Henderson will continue her Category 2 meal plan, and will continue to work on weight loss, exercise, and decreasing simple carbohydrates to help decrease the risk of diabetes. We will recheck labs at her next office visit.  4. At risk for activity intolerance Sandra Henderson was given approximately 15 minutes of exercise intolerance counseling today. She is 59 y.o. female and has risk factors exercise intolerance including obesity. She will follow up with orthopedic specialist as directed. We discussed intensive lifestyle modifications today with an emphasis on specific weight loss instructions and strategies. Sandra Henderson will slowly increase activity as tolerated.  Repetitive spaced learning was employed today to elicit superior memory formation and behavioral change.  5. Class 2 severe obesity with serious comorbidity and body mass index (BMI) of 37.0 to 37.9 in adult, unspecified obesity type [facility]) Sandra Henderson is currently in the action stage of change. As such, her goal is to continue with weight loss efforts. She has agreed to the Category 2 Plan + 100 calories.   Exercise goals: As is.  Behavioral modification strategies: increasing lean protein intake, decreasing simple carbohydrates, meal planning and cooking strategies and celebration eating strategies.  Sandra Henderson has agreed to follow-up with our clinic in 2 to 3 weeks. She was informed of the importance of frequent follow-up visits to maximize her success with intensive lifestyle modifications for her multiple health conditions.   Objective:   Blood pressure 127/79, pulse 85, temperature 98.1 F (36.7 C),  temperature source Oral, height 5\' 2"  (1.575 m), weight 202 lb (91.6 kg), last menstrual period 06/12/2018, SpO2 98 %. Body mass index is 36.95 kg/m.  General: Cooperative, alert, well developed, in no acute distress. HEENT:  Conjunctivae and lids unremarkable. Cardiovascular: Regular rhythm.  Lungs: Normal work of breathing. Neurologic: No focal deficits.   Lab Results  Component Value Date   CREATININE 0.77 07/05/2019   BUN 15 07/05/2019   NA 139 07/05/2019   K 4.6 07/05/2019   CL 98 07/05/2019   CO2 23 07/05/2019   Lab Results  Component Value Date   ALT 61 (H) 07/05/2019   AST 35 07/05/2019   ALKPHOS 85 07/05/2019   BILITOT 0.5 07/05/2019   Lab Results  Component Value Date   HGBA1C 5.4 07/05/2019   HGBA1C 5.7 (H) 09/27/2014   Lab Results  Component Value Date   INSULIN 31.3 (H) 07/05/2019   Lab Results  Component Value Date   TSH 1.380 07/05/2019   Lab Results  Component Value Date   CHOL 205 (H) 07/05/2019   HDL 49 07/05/2019   LDLCALC 122 (H) 07/05/2019   TRIG 194 (H) 07/05/2019   CHOLHDL 5.5 09/27/2014   Lab Results  Component Value Date   WBC 7.9 07/05/2019   HGB 15.5 07/05/2019   HCT 44.6 07/05/2019   MCV 93 07/05/2019   PLT 398 07/05/2019   No results found for: IRON, TIBC, FERRITIN  Attestation Statements:   Reviewed by clinician on day of visit: allergies, medications, problem list, medical history, surgical history, family history, social history, and previous encounter notes.   I, Trixie Dredge, am acting as transcriptionist for Dennard Nip, MD.  I have reviewed the above documentation for accuracy and completeness, and I agree with the above. -  Dennard Nip, MD

## 2019-10-01 DIAGNOSIS — M25562 Pain in left knee: Secondary | ICD-10-CM | POA: Diagnosis not present

## 2019-10-06 DIAGNOSIS — S83232D Complex tear of medial meniscus, current injury, left knee, subsequent encounter: Secondary | ICD-10-CM | POA: Diagnosis not present

## 2019-10-06 DIAGNOSIS — M25562 Pain in left knee: Secondary | ICD-10-CM | POA: Diagnosis not present

## 2019-10-10 ENCOUNTER — Other Ambulatory Visit (INDEPENDENT_AMBULATORY_CARE_PROVIDER_SITE_OTHER): Payer: Self-pay | Admitting: Family Medicine

## 2019-10-10 DIAGNOSIS — R7303 Prediabetes: Secondary | ICD-10-CM

## 2019-10-12 ENCOUNTER — Encounter (INDEPENDENT_AMBULATORY_CARE_PROVIDER_SITE_OTHER): Payer: Self-pay

## 2019-10-19 ENCOUNTER — Other Ambulatory Visit: Payer: Self-pay

## 2019-10-19 ENCOUNTER — Ambulatory Visit (INDEPENDENT_AMBULATORY_CARE_PROVIDER_SITE_OTHER): Payer: BC Managed Care – PPO | Admitting: Family Medicine

## 2019-10-19 ENCOUNTER — Encounter (INDEPENDENT_AMBULATORY_CARE_PROVIDER_SITE_OTHER): Payer: Self-pay | Admitting: Family Medicine

## 2019-10-19 ENCOUNTER — Other Ambulatory Visit (INDEPENDENT_AMBULATORY_CARE_PROVIDER_SITE_OTHER): Payer: Self-pay | Admitting: Family Medicine

## 2019-10-19 VITALS — BP 126/85 | HR 83 | Temp 98.1°F | Ht 62.0 in | Wt 201.0 lb

## 2019-10-19 DIAGNOSIS — E7849 Other hyperlipidemia: Secondary | ICD-10-CM

## 2019-10-19 DIAGNOSIS — E8881 Metabolic syndrome: Secondary | ICD-10-CM

## 2019-10-19 DIAGNOSIS — Z9189 Other specified personal risk factors, not elsewhere classified: Secondary | ICD-10-CM

## 2019-10-19 DIAGNOSIS — E559 Vitamin D deficiency, unspecified: Secondary | ICD-10-CM

## 2019-10-19 DIAGNOSIS — Z6836 Body mass index (BMI) 36.0-36.9, adult: Secondary | ICD-10-CM

## 2019-10-19 MED ORDER — VITAMIN D (ERGOCALCIFEROL) 1.25 MG (50000 UNIT) PO CAPS: 50000.0000 [IU] | ORAL_CAPSULE | ORAL | 0 refills | Status: DC

## 2019-10-19 NOTE — Progress Notes (Signed)
Chief Complaint:   OBESITY Sandra Henderson is here to discuss her progress with her obesity treatment plan along with follow-up of her obesity related diagnoses. Sandra Henderson is on the Category 2 Plan + 100 calories and states she is following her eating plan approximately 75-80% of the time. Sandra Henderson states she is walking 4,000 steps a day (armbands 10 minutes 4 times per week).  Today's visit was #: 7 Starting weight: 216 lbs Starting date: 07/05/2019 Today's weight: 201 lbs Today's date: 10/19/2019 Total lbs lost to date: 15 Total lbs lost since last in-office visit: 1  Interim History: Sandra Henderson continues to do well with weight loss on her Category 2 plan. Her hunger is controlled but she worries about hitting a plateau soon as this tends to happen around this weight.  Subjective:   1. Vitamin D deficiency Sandra Henderson is stable on Vit D, and she is due to have labs checked.  2. Other hyperlipidemia Sandra Henderson is working on diet, exercise, and weight loss to improve her cholesterol levels. She notes a family history of hyperlipidemia. She is on Zocor.  3. Insulin resistance Sandra Henderson is doing well with diet and exercise, and she is due to have labs checked.  4. At risk for heart disease Sandra Henderson is at a higher than average risk for cardiovascular disease due to obesity.   Assessment/Plan:   1. Vitamin D deficiency Low Vitamin D level contributes to fatigue and are associated with obesity, breast, and colon cancer. We will refill prescription Vitamin D for 1 month. Sandra Henderson will follow-up for routine testing of Vitamin D, at least 2-3 times per year to avoid over-replacement.  - VITAMIN D 25 Hydroxy (Vit-D Deficiency, Fractures)  - Vitamin D, Ergocalciferol, (DRISDOL) 1.25 MG (50000 UNIT) CAPS capsule; Take 1 capsule (50,000 Units total) by mouth every 7 (seven) days.  Dispense: 4 capsule; Refill: 0  2. Other hyperlipidemia Cardiovascular risk and specific lipid/LDL goals reviewed. We discussed several  lifestyle modifications today. Sandra Henderson will continue Zocor, and will continue to work on diet, exercise and weight loss efforts. We will check labs today. Orders and follow up as documented in patient record.   Counseling Intensive lifestyle modifications are the first line treatment for this issue. . Dietary changes: Increase soluble fiber. Decrease simple carbohydrates. . Exercise changes: Moderate to vigorous-intensity aerobic activity 150 minutes per week if tolerated. . Lipid-lowering medications: see documented in medical record.  - Comprehensive metabolic panel - Lipid Panel With LDL/HDL Ratio  3. Insulin resistance Sandra Henderson will continue to work on weight loss, diet, exercise, and decreasing simple carbohydrates to help decrease the risk of diabetes. We will check labs today. Sandra Henderson agreed to follow-up with [place] as directed to closely monitor her progress.  - Hemoglobin A1c - Insulin, random  4. At risk for heart disease Sandra Henderson was given approximately 15 minutes of coronary artery disease prevention counseling today. She is 59 y.o. female and has risk factors for heart disease including obesity. We discussed intensive lifestyle modifications today with an emphasis on specific weight loss instructions and strategies.   Repetitive spaced learning was employed today to elicit superior memory formation and behavioral change.  5. Class 2 severe obesity with serious comorbidity and body mass index (BMI) of 36.0 to 36.9 in adult, unspecified obesity type Sandra Henderson) Sandra Henderson is currently in the action stage of change. As such, her goal is to continue with weight loss efforts. She has agreed to the Category 2 Plan + 100 calories.   Sandra Henderson was reassured  she has been losing weight in a way that should minimize plateaus.  Exercise goals: As is.  Behavioral modification strategies: increasing lean protein intake.  Sandra Henderson has agreed to follow-up with our clinic in 3 weeks. She was informed of the  importance of frequent follow-up visits to maximize her success with intensive lifestyle modifications for her multiple health conditions.   Sandra Henderson was informed we would discuss her lab results at her next visit unless there is a critical issue that needs to be addressed sooner. Sandra Henderson agreed to keep her next visit at the agreed upon time to discuss these results.  Objective:   Blood pressure 126/85, pulse 83, temperature 98.1 F (36.7 C), temperature source Oral, height 5\' 2"  (1.575 m), weight 201 lb (91.2 kg), last menstrual period 06/12/2018, SpO2 96 %. Body mass index is 36.76 kg/m.  General: Cooperative, alert, well developed, in no acute distress. HEENT: Conjunctivae and lids unremarkable. Cardiovascular: Regular rhythm.  Lungs: Normal work of breathing. Neurologic: No focal deficits.   Lab Results  Component Value Date   CREATININE 0.77 07/05/2019   BUN 15 07/05/2019   NA 139 07/05/2019   K 4.6 07/05/2019   CL 98 07/05/2019   CO2 23 07/05/2019   Lab Results  Component Value Date   ALT 61 (H) 07/05/2019   AST 35 07/05/2019   ALKPHOS 85 07/05/2019   BILITOT 0.5 07/05/2019   Lab Results  Component Value Date   HGBA1C 5.4 07/05/2019   HGBA1C 5.7 (H) 09/27/2014   Lab Results  Component Value Date   INSULIN 31.3 (H) 07/05/2019   Lab Results  Component Value Date   TSH 1.380 07/05/2019   Lab Results  Component Value Date   CHOL 205 (H) 07/05/2019   HDL 49 07/05/2019   LDLCALC 122 (H) 07/05/2019   TRIG 194 (H) 07/05/2019   CHOLHDL 5.5 09/27/2014   Lab Results  Component Value Date   WBC 7.9 07/05/2019   HGB 15.5 07/05/2019   HCT 44.6 07/05/2019   MCV 93 07/05/2019   PLT 398 07/05/2019   No results found for: IRON, TIBC, FERRITIN  Attestation Statements:   Reviewed by clinician on day of visit: allergies, medications, problem list, medical history, surgical history, family history, social history, and previous encounter notes.   I, 07/07/2019, am  acting as transcriptionist for Burt Knack, MD.  I have reviewed the above documentation for accuracy and completeness, and I agree with the above. -  Quillian Quince, MD

## 2019-10-20 LAB — INSULIN, RANDOM: INSULIN: 23.8 u[IU]/mL (ref 2.6–24.9)

## 2019-10-20 LAB — COMPREHENSIVE METABOLIC PANEL
ALT: 31 IU/L (ref 0–32)
AST: 25 IU/L (ref 0–40)
Albumin/Globulin Ratio: 1.7 (ref 1.2–2.2)
Albumin: 4.8 g/dL (ref 3.8–4.9)
Alkaline Phosphatase: 77 IU/L (ref 48–121)
BUN/Creatinine Ratio: 22 (ref 12–28)
BUN: 16 mg/dL (ref 8–27)
Bilirubin Total: 0.5 mg/dL (ref 0.0–1.2)
CO2: 25 mmol/L (ref 20–29)
Calcium: 11 mg/dL — ABNORMAL HIGH (ref 8.7–10.3)
Chloride: 98 mmol/L (ref 96–106)
Creatinine, Ser: 0.72 mg/dL (ref 0.57–1.00)
GFR calc Af Amer: 105 mL/min/{1.73_m2} (ref >59)
GFR calc non Af Amer: 91 mL/min/{1.73_m2} (ref >59)
Globulin, Total: 2.9 g/dL (ref 1.5–4.5)
Glucose: 102 mg/dL — ABNORMAL HIGH (ref 65–99)
Potassium: 5 mmol/L (ref 3.5–5.2)
Sodium: 142 mmol/L (ref 134–144)
Total Protein: 7.7 g/dL (ref 6.0–8.5)

## 2019-10-20 LAB — LIPID PANEL WITH LDL/HDL RATIO
Cholesterol, Total: 218 mg/dL — ABNORMAL HIGH (ref 100–199)
HDL: 50 mg/dL (ref >39)
LDL Chol Calc (NIH): 137 mg/dL — ABNORMAL HIGH (ref 0–99)
LDL/HDL Ratio: 2.7 ratio (ref 0.0–3.2)
Triglycerides: 172 mg/dL — ABNORMAL HIGH (ref 0–149)
VLDL Cholesterol Cal: 31 mg/dL (ref 5–40)

## 2019-10-20 LAB — HEMOGLOBIN A1C
Est. average glucose Bld gHb Est-mCnc: 105 mg/dL
Hgb A1c MFr Bld: 5.3 % (ref 4.8–5.6)

## 2019-10-20 LAB — VITAMIN D 25 HYDROXY (VIT D DEFICIENCY, FRACTURES): Vit D, 25-Hydroxy: 69.3 ng/mL (ref 30.0–100.0)

## 2019-10-23 ENCOUNTER — Other Ambulatory Visit (INDEPENDENT_AMBULATORY_CARE_PROVIDER_SITE_OTHER): Payer: Self-pay | Admitting: Family Medicine

## 2019-10-23 DIAGNOSIS — E559 Vitamin D deficiency, unspecified: Secondary | ICD-10-CM

## 2019-11-07 ENCOUNTER — Encounter (INDEPENDENT_AMBULATORY_CARE_PROVIDER_SITE_OTHER): Payer: Self-pay | Admitting: Family Medicine

## 2019-11-07 ENCOUNTER — Other Ambulatory Visit: Payer: Self-pay

## 2019-11-07 ENCOUNTER — Ambulatory Visit (INDEPENDENT_AMBULATORY_CARE_PROVIDER_SITE_OTHER): Payer: BC Managed Care – PPO | Admitting: Family Medicine

## 2019-11-07 VITALS — BP 104/67 | HR 86 | Temp 98.1°F | Ht 62.0 in | Wt 196.0 lb

## 2019-11-07 DIAGNOSIS — I10 Essential (primary) hypertension: Secondary | ICD-10-CM

## 2019-11-07 DIAGNOSIS — E559 Vitamin D deficiency, unspecified: Secondary | ICD-10-CM | POA: Diagnosis not present

## 2019-11-07 DIAGNOSIS — R7303 Prediabetes: Secondary | ICD-10-CM

## 2019-11-07 DIAGNOSIS — Z6836 Body mass index (BMI) 36.0-36.9, adult: Secondary | ICD-10-CM

## 2019-11-07 DIAGNOSIS — Z9189 Other specified personal risk factors, not elsewhere classified: Secondary | ICD-10-CM

## 2019-11-07 MED ORDER — HYDROCHLOROTHIAZIDE 12.5 MG PO TABS: 12.5000 mg | ORAL_TABLET | Freq: Every day | ORAL | 0 refills | Status: DC

## 2019-11-07 MED ORDER — VITAMIN D (ERGOCALCIFEROL) 1.25 MG (50000 UNIT) PO CAPS: 50000.0000 [IU] | ORAL_CAPSULE | ORAL | 0 refills | Status: DC

## 2019-11-07 MED ORDER — METFORMIN HCL 500 MG PO TABS: ORAL_TABLET | ORAL | 0 refills | Status: DC

## 2019-11-08 ENCOUNTER — Ambulatory Visit (INDEPENDENT_AMBULATORY_CARE_PROVIDER_SITE_OTHER): Payer: BC Managed Care – PPO | Admitting: Physician Assistant

## 2019-11-08 NOTE — Progress Notes (Signed)
Chief Complaint:   OBESITY Sandra Henderson is here to discuss her progress with her obesity treatment plan along with follow-up of her obesity related diagnoses. Sandra Henderson is on the Category 2 Plan + 100 calories and states she is following her eating plan approximately 80% of the time. Sandra Henderson states she is walking 5,000-6,000 steps 7 times per week, and arm bands for 15 minutes 3-4 times per week.  Today's visit was #: 8 Starting weight: 216 lbs Starting date: 07/05/2019 Today's weight: 196 lbs Today's date: 11/07/2019 Total lbs lost to date: 20 Total lbs lost since last in-office visit: 5  Interim History: Sandra Henderson continues to do well with weight loss. She is following her Category 2 plan mostly with some small deviations, and she is doing well with weight and her hunger is controlled.  Subjective:   1. Essential hypertension Sandra Henderson's blood pressure is well controlled and she is at risk of hypotension. She denies current lightheadedness.  2. Vitamin D deficiency Sandra Henderson's Vit D level is at goal and her Ca+ is slightly elevated. She has a history of nephrolithiasis.  3. Pre-diabetes Sandra Henderson's A1c and insulin have improved with diet and weight loss. Her hunger is controlled. She denies hypoglycemia.  4. At risk for complication associated with hypotension The patient is at a higher than average risk of hypotension due to weight loss.  Assessment/Plan:   1. Essential hypertension Sandra Henderson is working on healthy weight loss and exercise to improve blood pressure control. We will watch for signs of hypotension as she continues her lifestyle modifications. Sandra Henderson agreed to change hydrochlorothiazide to 12.5 mg q daily with no refills, and will increase her water intake.  - hydrochlorothiazide (HYDRODIURIL) 12.5 MG tablet; Take 1 tablet (12.5 mg total) by mouth daily.  Dispense: 30 tablet; Refill: 0  2. Vitamin D deficiency Low Vitamin D level contributes to fatigue and are associated with  obesity, breast, and colon cancer. Sandra Henderson agrees to change prescription Vitamin D to 50,000 IU every 14 days with no refills, and will increase her water intake. She and will follow-up for routine testing of Vitamin D, at least 2-3 times per year to avoid over-replacement.  - Vitamin D, Ergocalciferol, (DRISDOL) 1.25 MG (50000 UNIT) CAPS capsule; Take 1 capsule (50,000 Units total) by mouth every 14 (fourteen) days.  Dispense: 4 capsule; Refill: 0  3. Pre-diabetes Sandra Henderson will continue to work on weight loss, exercise, and decreasing simple carbohydrates to help decrease the risk of diabetes. We will refill metformin for 1 month.  - metFORMIN (GLUCOPHAGE) 500 MG tablet; TAKE 1 TABLET(500 MG) BY MOUTH EVERY MORNING  Dispense: 30 tablet; Refill: 0  4. At risk for complication associated with hypotension Sandra Henderson was given approximately 15 minutes of education and counseling today to help avoid hypotension. We discussed risks of hypotension with weight loss and signs of hypotension such as feeling lightheaded or unsteady.  Repetitive spaced learning was employed today to elicit superior memory formation and behavioral change.  5. Class 2 severe obesity with serious comorbidity and body mass index (BMI) of 36.0 to 36.9 in adult, unspecified obesity type Sandra Henderson) Sandra Henderson is currently in the action stage of change. As such, her goal is to continue with weight loss efforts. She has agreed to the Category 2 Plan + 100 calories.   Exercise goals: As is.  Behavioral modification strategies: increasing lean protein intake and better snacking choices.  Sandra Henderson has agreed to follow-up with our clinic in 4 weeks. She was informed of the  importance of frequent follow-up visits to maximize her success with intensive lifestyle modifications for her multiple health conditions.   Objective:   Blood pressure 104/67, pulse 86, temperature 98.1 F (36.7 C), temperature source Oral, height 5\' 2"  (1.575 m), weight 196 lb  (88.9 kg), last menstrual period 06/12/2018, SpO2 98 %. Body mass index is 35.85 kg/m.  General: Cooperative, alert, well developed, in no acute distress. HEENT: Conjunctivae and lids unremarkable. Cardiovascular: Regular rhythm.  Lungs: Normal work of breathing. Neurologic: No focal deficits.   Lab Results  Component Value Date   CREATININE 0.72 10/19/2019   BUN 16 10/19/2019   NA 142 10/19/2019   K 5.0 10/19/2019   CL 98 10/19/2019   CO2 25 10/19/2019   Lab Results  Component Value Date   ALT 31 10/19/2019   AST 25 10/19/2019   ALKPHOS 77 10/19/2019   BILITOT 0.5 10/19/2019   Lab Results  Component Value Date   HGBA1C 5.3 10/19/2019   HGBA1C 5.4 07/05/2019   HGBA1C 5.7 (H) 09/27/2014   Lab Results  Component Value Date   INSULIN 23.8 10/19/2019   INSULIN 31.3 (H) 07/05/2019   Lab Results  Component Value Date   TSH 1.380 07/05/2019   Lab Results  Component Value Date   CHOL 218 (H) 10/19/2019   HDL 50 10/19/2019   LDLCALC 137 (H) 10/19/2019   TRIG 172 (H) 10/19/2019   CHOLHDL 5.5 09/27/2014   Lab Results  Component Value Date   WBC 7.9 07/05/2019   HGB 15.5 07/05/2019   HCT 44.6 07/05/2019   MCV 93 07/05/2019   PLT 398 07/05/2019   No results found for: IRON, TIBC, FERRITIN  Attestation Statements:   Reviewed by clinician on day of visit: allergies, medications, problem list, medical history, surgical history, family history, social history, and previous encounter notes.   I, 07/07/2019, am acting as transcriptionist for Burt Knack, MD.  I have reviewed the above documentation for accuracy and completeness, and I agree with the above. -  Quillian Quince, MD

## 2019-11-09 ENCOUNTER — Other Ambulatory Visit (INDEPENDENT_AMBULATORY_CARE_PROVIDER_SITE_OTHER): Payer: Self-pay | Admitting: Family Medicine

## 2019-11-09 DIAGNOSIS — E559 Vitamin D deficiency, unspecified: Secondary | ICD-10-CM

## 2019-11-23 DIAGNOSIS — S83262A Peripheral tear of lateral meniscus, current injury, left knee, initial encounter: Secondary | ICD-10-CM | POA: Diagnosis not present

## 2019-11-23 DIAGNOSIS — S83232A Complex tear of medial meniscus, current injury, left knee, initial encounter: Secondary | ICD-10-CM | POA: Diagnosis not present

## 2019-11-23 DIAGNOSIS — X58XXXA Exposure to other specified factors, initial encounter: Secondary | ICD-10-CM | POA: Diagnosis not present

## 2019-11-23 DIAGNOSIS — G8918 Other acute postprocedural pain: Secondary | ICD-10-CM | POA: Diagnosis not present

## 2019-11-23 DIAGNOSIS — Y999 Unspecified external cause status: Secondary | ICD-10-CM | POA: Diagnosis not present

## 2019-11-23 DIAGNOSIS — M94262 Chondromalacia, left knee: Secondary | ICD-10-CM | POA: Diagnosis not present

## 2019-12-07 ENCOUNTER — Ambulatory Visit (INDEPENDENT_AMBULATORY_CARE_PROVIDER_SITE_OTHER): Payer: BC Managed Care – PPO | Admitting: Family Medicine

## 2019-12-12 ENCOUNTER — Encounter (INDEPENDENT_AMBULATORY_CARE_PROVIDER_SITE_OTHER): Payer: Self-pay | Admitting: Family Medicine

## 2019-12-12 ENCOUNTER — Other Ambulatory Visit: Payer: Self-pay

## 2019-12-12 ENCOUNTER — Ambulatory Visit (INDEPENDENT_AMBULATORY_CARE_PROVIDER_SITE_OTHER): Payer: BC Managed Care – PPO | Admitting: Family Medicine

## 2019-12-12 VITALS — BP 117/86 | HR 91 | Temp 98.2°F | Ht 62.0 in | Wt 195.0 lb

## 2019-12-12 DIAGNOSIS — Z6835 Body mass index (BMI) 35.0-35.9, adult: Secondary | ICD-10-CM

## 2019-12-12 DIAGNOSIS — Z9189 Other specified personal risk factors, not elsewhere classified: Secondary | ICD-10-CM

## 2019-12-12 DIAGNOSIS — R7303 Prediabetes: Secondary | ICD-10-CM | POA: Diagnosis not present

## 2019-12-12 DIAGNOSIS — I10 Essential (primary) hypertension: Secondary | ICD-10-CM

## 2019-12-12 MED ORDER — HYDROCHLOROTHIAZIDE 12.5 MG PO TABS: 12.5000 mg | ORAL_TABLET | Freq: Every day | ORAL | 0 refills | Status: AC

## 2019-12-12 MED ORDER — METFORMIN HCL 500 MG PO TABS: ORAL_TABLET | ORAL | 0 refills | Status: DC

## 2019-12-12 NOTE — Progress Notes (Signed)
Chief Complaint:   OBESITY Sandra Henderson is here to discuss her progress with her obesity treatment plan along with follow-up of her obesity related diagnoses. Sandra Henderson is on the Category 2 Plan + 100 calories and states she is following her eating plan approximately 70-75% of the time. See states she is doing 0 minutes 0 times per week.  Today's visit was #: 9 Starting weight: 216 lbs Starting date: 07/05/2019 Today's weight: 195 lbs Today's date: 12/12/2019 Total lbs lost to date: 21 Total lbs lost since last in-office visit: 1  Interim History: Sandra Henderson has done well avoiding weight gain while recovering from knee surgery. She is starting physical therapy for her knee soon and is ambulating with a cane now instead of a walker.  Subjective:   1. Pre-diabetes Sandra Henderson is stable on metformin, and she denies nausea or vomiting. She is working on diet and weight loss.  2. Essential hypertension Sandra Henderson's blood pressure is well controlled today. She has had some elevated blood pressure at 135/89, but she is recovering from knee surgery.  3. At risk for complication associated with hypotension The patient is at a higher than average risk of hypotension due to low blood pressure.  Assessment/Plan:   1. Pre-diabetes Sandra Henderson will continue to work on weight loss, exercise, and decreasing simple carbohydrates to help decrease the risk of diabetes. We will refill metformin for 1 month.  - metFORMIN (GLUCOPHAGE) 500 MG tablet; TAKE 1 TABLET(500 MG) BY MOUTH EVERY MORNING  Dispense: 30 tablet; Refill: 0  2. Essential hypertension Sandra Henderson is working on healthy weight loss and exercise to improve blood pressure control. Will continue to monitor, and will watch for signs of hypotension as she continues her lifestyle modifications. We will refill hydrochlorothiazide for 1 month.  - hydrochlorothiazide (HYDRODIURIL) 12.5 MG tablet; Take 1 tablet (12.5 mg total) by mouth daily.  Dispense: 30 tablet;  Refill: 0  3. At risk for complication associated with hypotension Sandra Henderson was given approximately 15 minutes of education and counseling today to help avoid hypotension. We discussed risks of hypotension with weight loss and signs of hypotension such as feeling lightheaded or unsteady.  Repetitive spaced learning was employed today to elicit superior memory formation and behavioral change.  4. Class 2 severe obesity with serious comorbidity and body mass index (BMI) of 35.0 to 35.9 in adult, unspecified obesity type Sandra Henderson) Sandra Henderson is currently in the action stage of change. As such, her goal is to continue with weight loss efforts. She has agreed to the Category 2 Plan + 100 calories.   Sandra Henderson were discussed.  Behavioral modification strategies: increasing Sandra protein intake.  Ashima has agreed to follow-up with our clinic in 3 weeks. She was informed of the importance of frequent follow-up visits to maximize her success with intensive lifestyle modifications for her multiple health conditions.   Objective:   Blood pressure 117/86, pulse 91, temperature 98.2 F (36.8 C), temperature source Oral, height 5\' 2"  (1.575 m), weight 195 lb (88.5 kg), last menstrual period 06/12/2018, SpO2 96 %. Body mass index is 35.67 kg/m.  General: Cooperative, alert, well developed, in no acute distress. HEENT: Conjunctivae and lids unremarkable. Cardiovascular: Regular rhythm.  Lungs: Normal work of breathing. Neurologic: No focal deficits.   Lab Results  Component Value Date   CREATININE 0.72 10/19/2019   BUN 16 10/19/2019   NA 142 10/19/2019   K 5.0 10/19/2019   CL 98 10/19/2019   CO2 25 10/19/2019   Lab Results  Component Value Date   ALT 31 10/19/2019   AST 25 10/19/2019   ALKPHOS 77 10/19/2019   BILITOT 0.5 10/19/2019   Lab Results  Component Value Date   HGBA1C 5.3 10/19/2019   HGBA1C 5.4 07/05/2019   HGBA1C 5.7 (H) 09/27/2014   Lab Results  Component Value Date     INSULIN 23.8 10/19/2019   INSULIN 31.3 (H) 07/05/2019   Lab Results  Component Value Date   TSH 1.380 07/05/2019   Lab Results  Component Value Date   CHOL 218 (H) 10/19/2019   HDL 50 10/19/2019   LDLCALC 137 (H) 10/19/2019   TRIG 172 (H) 10/19/2019   CHOLHDL 5.5 09/27/2014   Lab Results  Component Value Date   WBC 7.9 07/05/2019   HGB 15.5 07/05/2019   HCT 44.6 07/05/2019   MCV 93 07/05/2019   PLT 398 07/05/2019   No results found for: IRON, TIBC, FERRITIN  Attestation Statements:   Reviewed by clinician on day of visit: allergies, medications, problem list, medical history, surgical history, family history, social history, and previous encounter notes.   I, Burt Knack, am acting as transcriptionist for Quillian Quince, MD.  I have reviewed the above documentation for accuracy and completeness, and I agree with the above. -  Quillian Quince, MD

## 2019-12-14 ENCOUNTER — Other Ambulatory Visit (INDEPENDENT_AMBULATORY_CARE_PROVIDER_SITE_OTHER): Payer: Self-pay | Admitting: Family Medicine

## 2019-12-14 DIAGNOSIS — M25562 Pain in left knee: Secondary | ICD-10-CM | POA: Diagnosis not present

## 2019-12-14 DIAGNOSIS — I10 Essential (primary) hypertension: Secondary | ICD-10-CM

## 2019-12-16 DIAGNOSIS — M25562 Pain in left knee: Secondary | ICD-10-CM | POA: Diagnosis not present

## 2019-12-20 DIAGNOSIS — M25562 Pain in left knee: Secondary | ICD-10-CM | POA: Diagnosis not present

## 2019-12-22 DIAGNOSIS — M25562 Pain in left knee: Secondary | ICD-10-CM | POA: Diagnosis not present

## 2019-12-26 DIAGNOSIS — M25562 Pain in left knee: Secondary | ICD-10-CM | POA: Diagnosis not present

## 2019-12-29 DIAGNOSIS — M25562 Pain in left knee: Secondary | ICD-10-CM | POA: Diagnosis not present

## 2020-01-02 ENCOUNTER — Ambulatory Visit (INDEPENDENT_AMBULATORY_CARE_PROVIDER_SITE_OTHER): Payer: BC Managed Care – PPO | Admitting: Family Medicine

## 2020-01-02 ENCOUNTER — Other Ambulatory Visit: Payer: Self-pay

## 2020-01-02 ENCOUNTER — Other Ambulatory Visit (INDEPENDENT_AMBULATORY_CARE_PROVIDER_SITE_OTHER): Payer: Self-pay | Admitting: Family Medicine

## 2020-01-02 ENCOUNTER — Encounter (INDEPENDENT_AMBULATORY_CARE_PROVIDER_SITE_OTHER): Payer: Self-pay | Admitting: Family Medicine

## 2020-01-02 VITALS — BP 133/85 | HR 88 | Temp 98.1°F | Ht 62.0 in | Wt 196.0 lb

## 2020-01-02 DIAGNOSIS — R7303 Prediabetes: Secondary | ICD-10-CM | POA: Diagnosis not present

## 2020-01-02 DIAGNOSIS — Z9189 Other specified personal risk factors, not elsewhere classified: Secondary | ICD-10-CM

## 2020-01-02 DIAGNOSIS — E559 Vitamin D deficiency, unspecified: Secondary | ICD-10-CM

## 2020-01-02 DIAGNOSIS — Z6835 Body mass index (BMI) 35.0-35.9, adult: Secondary | ICD-10-CM

## 2020-01-02 MED ORDER — VITAMIN D (ERGOCALCIFEROL) 1.25 MG (50000 UNIT) PO CAPS: 50000.0000 [IU] | ORAL_CAPSULE | ORAL | 0 refills | Status: DC

## 2020-01-02 MED ORDER — METFORMIN HCL 500 MG PO TABS: ORAL_TABLET | ORAL | 0 refills | Status: DC

## 2020-01-02 NOTE — Progress Notes (Signed)
Chief Complaint:   OBESITY Sandra Henderson is here to discuss her progress with her obesity treatment plan along with follow-up of her obesity related diagnoses. Sandra Henderson is on the Category 2 Plan + 100 calories and states she is following her eating plan approximately 60% of the time. Sandra Henderson states she is doing PT and strengthening for 30-60 minutes 2-7 times per week.  Today's visit was #: 10 Starting weight: 216 lbs Starting date: 07/05/2019 Today's weight: 196 lbs Today's date: 01/02/2020 Total lbs lost to date: 20 Total lbs lost since last in-office visit: 0  Interim History: Sandra Henderson has been traveling and having company, and she has increased eating out. She had gained a bit more but she already has gotten back on track.  Subjective:   1. Pre-diabetes Sandra Henderson is doing well with metformin. She denies nausea, vomiting, or hypoglycemia.  2. Vitamin D deficiency Sandra Henderson is stable on Vit D, but her level is not yet at goal.  3. At risk for dehydration Sandra Henderson is at risk for dehydration due to inadequate water intake.  Assessment/Plan:   1. Pre-diabetes Sandra Henderson will continue to work on weight loss, exercise, and decreasing simple carbohydrates to help decrease the risk of diabetes. We will refill metformin for 1 month.   - metFORMIN (GLUCOPHAGE) 500 MG tablet; TAKE 1 TABLET(500 MG) BY MOUTH EVERY MORNING  Dispense: 30 tablet; Refill: 0  2. Vitamin D deficiency Low Vitamin D level contributes to fatigue and are associated with obesity, breast, and colon cancer. We will refill prescription Vitamin D for 1 month. Sandra Henderson will follow-up for routine testing of Vitamin D, at least 2-3 times per year to avoid over-replacement.  - Vitamin D, Ergocalciferol, (DRISDOL) 1.25 MG (50000 UNIT) CAPS capsule; Take 1 capsule (50,000 Units total) by mouth every 14 (fourteen) days.  Dispense: 4 capsule; Refill: 0  3. At risk for dehydration Sandra Henderson was given approximately 15 minutes dehydration prevention  counseling today. Sandra Henderson is at risk for dehydration due to weight loss and current medication(s). She was encouraged to hydrate and monitor fluid status to avoid dehydration as well as weight loss plateaus.   4. Class 2 severe obesity with serious comorbidity and body mass index (BMI) of 35.0 to 35.9 in adult, unspecified obesity type Sandra Henderson, Sandra Henderson) Sandra Henderson is currently in the action stage of change. As such, her goal is to continue with weight loss efforts. She has agreed to the Category 2 Plan or following a lower carbohydrate, vegetable and lean protein rich diet plan.   Exercise goals: As is.  Behavioral modification strategies: increasing lean protein intake, increasing water intake and meal planning and cooking strategies.  Sandra Henderson has agreed to follow-up with our clinic in 2 to 3 weeks. She was informed of the importance of frequent follow-up visits to maximize her success with intensive lifestyle modifications for her multiple health conditions.   Objective:   Blood pressure 133/85, pulse 88, temperature 98.1 F (36.7 C), temperature source Oral, height 5\' 2"  (1.575 m), weight 196 lb (88.9 kg), last menstrual period 06/12/2018, SpO2 95 %. Body mass index is 35.85 kg/m.  General: Cooperative, alert, well developed, in no acute distress. HEENT: Conjunctivae and lids unremarkable. Cardiovascular: Regular rhythm.  Lungs: Normal work of breathing. Neurologic: No focal deficits.   Lab Results  Component Value Date   CREATININE 0.72 10/19/2019   BUN 16 10/19/2019   NA 142 10/19/2019   K 5.0 10/19/2019   CL 98 10/19/2019   CO2 25 10/19/2019   Lab  Results  Component Value Date   ALT 31 10/19/2019   AST 25 10/19/2019   ALKPHOS 77 10/19/2019   BILITOT 0.5 10/19/2019   Lab Results  Component Value Date   HGBA1C 5.3 10/19/2019   HGBA1C 5.4 07/05/2019   HGBA1C 5.7 (H) 09/27/2014   Lab Results  Component Value Date   INSULIN 23.8 10/19/2019   INSULIN 31.3 (H) 07/05/2019   Lab Results   Component Value Date   TSH 1.380 07/05/2019   Lab Results  Component Value Date   CHOL 218 (H) 10/19/2019   HDL 50 10/19/2019   LDLCALC 137 (H) 10/19/2019   TRIG 172 (H) 10/19/2019   CHOLHDL 5.5 09/27/2014   Lab Results  Component Value Date   WBC 7.9 07/05/2019   HGB 15.5 07/05/2019   HCT 44.6 07/05/2019   MCV 93 07/05/2019   PLT 398 07/05/2019   No results found for: IRON, TIBC, FERRITIN  Attestation Statements:   Reviewed by clinician on day of visit: allergies, medications, problem list, medical history, surgical history, family history, social history, and previous encounter notes.   I, Burt Knack, am acting as transcriptionist for Quillian Quince, MD.  I have reviewed the above documentation for accuracy and completeness, and I agree with the above. -  Quillian Quince, MD

## 2020-01-03 ENCOUNTER — Other Ambulatory Visit (INDEPENDENT_AMBULATORY_CARE_PROVIDER_SITE_OTHER): Payer: Self-pay | Admitting: Family Medicine

## 2020-01-03 DIAGNOSIS — M25562 Pain in left knee: Secondary | ICD-10-CM | POA: Diagnosis not present

## 2020-01-03 DIAGNOSIS — R7303 Prediabetes: Secondary | ICD-10-CM

## 2020-01-05 DIAGNOSIS — M25562 Pain in left knee: Secondary | ICD-10-CM | POA: Diagnosis not present

## 2020-01-12 DIAGNOSIS — M25562 Pain in left knee: Secondary | ICD-10-CM | POA: Diagnosis not present

## 2020-01-18 DIAGNOSIS — M25562 Pain in left knee: Secondary | ICD-10-CM | POA: Diagnosis not present

## 2020-01-24 ENCOUNTER — Ambulatory Visit (INDEPENDENT_AMBULATORY_CARE_PROVIDER_SITE_OTHER): Payer: BC Managed Care – PPO | Admitting: Family Medicine

## 2020-01-24 ENCOUNTER — Encounter (INDEPENDENT_AMBULATORY_CARE_PROVIDER_SITE_OTHER): Payer: Self-pay | Admitting: Family Medicine

## 2020-01-24 ENCOUNTER — Other Ambulatory Visit: Payer: Self-pay

## 2020-01-24 VITALS — BP 130/85 | HR 78 | Temp 98.3°F | Ht 62.0 in | Wt 196.0 lb

## 2020-01-24 DIAGNOSIS — Z9189 Other specified personal risk factors, not elsewhere classified: Secondary | ICD-10-CM

## 2020-01-24 DIAGNOSIS — E559 Vitamin D deficiency, unspecified: Secondary | ICD-10-CM | POA: Diagnosis not present

## 2020-01-24 DIAGNOSIS — R7303 Prediabetes: Secondary | ICD-10-CM | POA: Diagnosis not present

## 2020-01-24 DIAGNOSIS — Z6835 Body mass index (BMI) 35.0-35.9, adult: Secondary | ICD-10-CM

## 2020-01-24 DIAGNOSIS — E7849 Other hyperlipidemia: Secondary | ICD-10-CM | POA: Diagnosis not present

## 2020-01-24 DIAGNOSIS — E8881 Metabolic syndrome: Secondary | ICD-10-CM | POA: Diagnosis not present

## 2020-01-25 LAB — COMPREHENSIVE METABOLIC PANEL
ALT: 29 IU/L (ref 0–32)
AST: 24 IU/L (ref 0–40)
Albumin/Globulin Ratio: 1.8 (ref 1.2–2.2)
Albumin: 4.8 g/dL (ref 3.8–4.8)
Alkaline Phosphatase: 81 IU/L (ref 44–121)
BUN/Creatinine Ratio: 23 (ref 12–28)
BUN: 14 mg/dL (ref 8–27)
Bilirubin Total: 0.5 mg/dL (ref 0.0–1.2)
CO2: 28 mmol/L (ref 20–29)
Calcium: 10.3 mg/dL (ref 8.7–10.3)
Chloride: 102 mmol/L (ref 96–106)
Creatinine, Ser: 0.62 mg/dL (ref 0.57–1.00)
GFR calc Af Amer: 113 mL/min/{1.73_m2} (ref >59)
GFR calc non Af Amer: 98 mL/min/{1.73_m2} (ref >59)
Globulin, Total: 2.7 g/dL (ref 1.5–4.5)
Glucose: 99 mg/dL (ref 65–99)
Potassium: 4.4 mmol/L (ref 3.5–5.2)
Sodium: 143 mmol/L (ref 134–144)
Total Protein: 7.5 g/dL (ref 6.0–8.5)

## 2020-01-25 LAB — LIPID PANEL WITH LDL/HDL RATIO
Cholesterol, Total: 211 mg/dL — ABNORMAL HIGH (ref 100–199)
HDL: 53 mg/dL (ref >39)
LDL Chol Calc (NIH): 134 mg/dL — ABNORMAL HIGH (ref 0–99)
LDL/HDL Ratio: 2.5 ratio (ref 0.0–3.2)
Triglycerides: 136 mg/dL (ref 0–149)
VLDL Cholesterol Cal: 24 mg/dL (ref 5–40)

## 2020-01-25 LAB — HEMOGLOBIN A1C
Est. average glucose Bld gHb Est-mCnc: 97 mg/dL
Hgb A1c MFr Bld: 5 % (ref 4.8–5.6)

## 2020-01-25 LAB — VITAMIN D 25 HYDROXY (VIT D DEFICIENCY, FRACTURES): Vit D, 25-Hydroxy: 59.7 ng/mL (ref 30.0–100.0)

## 2020-01-25 LAB — INSULIN, RANDOM: INSULIN: 20.8 u[IU]/mL (ref 2.6–24.9)

## 2020-01-30 MED ORDER — METFORMIN HCL 500 MG PO TABS: ORAL_TABLET | ORAL | 0 refills | Status: DC

## 2020-01-30 NOTE — Progress Notes (Signed)
Chief Complaint:   OBESITY Sandra Henderson is here to discuss her progress with her obesity treatment plan along with follow-up of her obesity related diagnoses. Sandra Henderson is on the Category 2 Plan and following a lower carbohydrate, vegetable and lean protein rich diet plan and states she is following her eating plan approximately 60-70% of the time. Sandra Henderson states she is doing physical therapy for 60 minutes 2 times per week, and walking 5,000 steps daily.  Today's visit was #: 11 Starting weight: 216 lbs Starting date: 07/05/2019 Today's weight: 196 lbs Today's date: 01/24/2020 Total lbs lost to date: 20 Total lbs lost since last in-office visit: 0  Interim History: Sandra Henderson has done well maintaining her weight, but she is starting to deviate from her plan more. She is going on vacation and is planning to still be mindful.  Subjective:   1. Insulin resistance Sandra Henderson is on metformin, and she denies nausea or vomiting. She is working on weight loss.  2. Vitamin D deficiency Sandra Henderson is on Vit D every other week. She is doing well and her last labs were at goal. She was at risk of over-replacement.  3. At risk for impaired metabolic function Sandra Henderson is at increased risk for impaired metabolic function due to deviating from her plan.  Assessment/Plan:   1. Insulin resistance Sandra Henderson will continue to work on weight loss, exercise, and decreasing simple carbohydrates to help decrease the risk of diabetes. We will check labs today, and we will refill metformin 500 mg q AM #30 for 1 month. Sandra Henderson agreed to follow-up with [place] as directed to closely monitor her progress.  - Comprehensive metabolic panel - Lipid Panel With LDL/HDL Ratio - Hemoglobin A1c - Insulin, random  2. Vitamin D deficiency Low Vitamin D level contributes to fatigue and are associated with obesity, breast, and colon cancer. We will check labs today, and will follow closely. Sandra Henderson will follow-up for routine testing of Vitamin D,  at least 2-3 times per year to avoid over-replacement.  - VITAMIN D 25 Hydroxy (Vit-D Deficiency, Fractures)  3. At risk for impaired metabolic function Sandra Henderson was given approximately 15 minutes of impaired  metabolic function prevention counseling today. We discussed intensive lifestyle modifications today with an emphasis on specific nutrition and exercise instructions and strategies.   Repetitive spaced learning was employed today to elicit superior memory formation and behavioral change.  4. Class 2 severe obesity with serious comorbidity and body mass index (BMI) of 35.0 to 35.9 in adult, unspecified obesity type Sandra Henderson) Sandra Henderson is currently in the action stage of change. As such, her goal is to continue with weight loss efforts. She has agreed to the Category 2 Plan + 100 calories.   Exercise goals: As is.  Behavioral modification strategies: increasing lean protein intake and meal planning and cooking strategies.  Sandra Henderson has agreed to follow-up with our clinic in 2 to 3 weeks. She was informed of the importance of frequent follow-up visits to maximize her success with intensive lifestyle modifications for her multiple health conditions.   Sandra Henderson was informed we would discuss her lab results at her next visit unless there is a critical issue that needs to be addressed sooner. Sandra Henderson agreed to keep her next visit at the agreed upon time to discuss these results.  Objective:   Blood pressure 130/85, pulse 78, temperature 98.3 F (36.8 C), height 5\' 2"  (1.575 m), weight 196 lb (88.9 kg), last menstrual period 06/12/2018, SpO2 96 %. Body mass index is 35.85  kg/m.  General: Cooperative, alert, well developed, in no acute distress. HEENT: Conjunctivae and lids unremarkable. Cardiovascular: Regular rhythm.  Lungs: Normal work of breathing. Neurologic: No focal deficits.   Lab Results  Component Value Date   CREATININE 0.62 01/24/2020   BUN 14 01/24/2020   NA 143 01/24/2020   K 4.4  01/24/2020   CL 102 01/24/2020   CO2 28 01/24/2020   Lab Results  Component Value Date   ALT 29 01/24/2020   AST 24 01/24/2020   ALKPHOS 81 01/24/2020   BILITOT 0.5 01/24/2020   Lab Results  Component Value Date   HGBA1C 5.0 01/24/2020   HGBA1C 5.3 10/19/2019   HGBA1C 5.4 07/05/2019   HGBA1C 5.7 (H) 09/27/2014   Lab Results  Component Value Date   INSULIN 20.8 01/24/2020   INSULIN 23.8 10/19/2019   INSULIN 31.3 (H) 07/05/2019   Lab Results  Component Value Date   TSH 1.380 07/05/2019   Lab Results  Component Value Date   CHOL 211 (H) 01/24/2020   HDL 53 01/24/2020   LDLCALC 134 (H) 01/24/2020   TRIG 136 01/24/2020   CHOLHDL 5.5 09/27/2014   Lab Results  Component Value Date   WBC 7.9 07/05/2019   HGB 15.5 07/05/2019   HCT 44.6 07/05/2019   MCV 93 07/05/2019   PLT 398 07/05/2019   No results found for: IRON, TIBC, FERRITIN  Attestation Statements:   Reviewed by clinician on day of visit: allergies, medications, problem list, medical history, surgical history, family history, social history, and previous encounter notes.   I, Burt Knack, am acting as transcriptionist for Quillian Quince, MD.  I have reviewed the above documentation for accuracy and completeness, and I agree with the above. -  Quillian Quince, MD

## 2020-02-07 ENCOUNTER — Other Ambulatory Visit (INDEPENDENT_AMBULATORY_CARE_PROVIDER_SITE_OTHER): Payer: Self-pay

## 2020-02-07 DIAGNOSIS — E8881 Metabolic syndrome: Secondary | ICD-10-CM

## 2020-02-07 MED ORDER — METFORMIN HCL 500 MG PO TABS: ORAL_TABLET | ORAL | 0 refills | Status: DC

## 2020-02-20 ENCOUNTER — Other Ambulatory Visit (INDEPENDENT_AMBULATORY_CARE_PROVIDER_SITE_OTHER): Payer: Self-pay | Admitting: Family Medicine

## 2020-02-20 ENCOUNTER — Ambulatory Visit (INDEPENDENT_AMBULATORY_CARE_PROVIDER_SITE_OTHER): Payer: BC Managed Care – PPO | Admitting: Family Medicine

## 2020-02-20 ENCOUNTER — Encounter (INDEPENDENT_AMBULATORY_CARE_PROVIDER_SITE_OTHER): Payer: Self-pay | Admitting: Family Medicine

## 2020-02-20 ENCOUNTER — Other Ambulatory Visit: Payer: Self-pay

## 2020-02-20 VITALS — BP 142/92 | HR 82 | Temp 98.8°F | Ht 62.0 in | Wt 194.0 lb

## 2020-02-20 DIAGNOSIS — E559 Vitamin D deficiency, unspecified: Secondary | ICD-10-CM | POA: Diagnosis not present

## 2020-02-20 DIAGNOSIS — Z9189 Other specified personal risk factors, not elsewhere classified: Secondary | ICD-10-CM

## 2020-02-20 DIAGNOSIS — I10 Essential (primary) hypertension: Secondary | ICD-10-CM

## 2020-02-20 DIAGNOSIS — Z6835 Body mass index (BMI) 35.0-35.9, adult: Secondary | ICD-10-CM

## 2020-02-20 DIAGNOSIS — E8881 Metabolic syndrome: Secondary | ICD-10-CM | POA: Diagnosis not present

## 2020-02-20 MED ORDER — VITAMIN D (ERGOCALCIFEROL) 1.25 MG (50000 UNIT) PO CAPS: 50000.0000 [IU] | ORAL_CAPSULE | ORAL | 0 refills | Status: DC

## 2020-02-20 NOTE — Progress Notes (Signed)
Chief Complaint:   OBESITY Sandra Henderson is here to discuss her progress with her obesity treatment plan along with follow-up of her obesity related diagnoses. Sandra Henderson is on the Category 2 Plan + 100 calories and states she is following her eating plan approximately 80% of the time. Sandra Henderson states she is walking 5,000-7,000 steps 20 minutes 6 times per week.  Today's visit was #: 12 Starting weight: 216 lbs Starting date: 07/05/2019 Today's weight: 194 lbs Today's date: 02/20/2020 Total lbs lost to date: 22 Total lbs lost since last in-office visit: 2  Interim History: Sandra Henderson continues to do well with weight loss and she feels she is doing well eating all of her food. She is mindful of her food choices even while eating out. She isn't concerned about weight gain over Halloween holiday.  Subjective:   1. Essential hypertension Zameria's blood pressure is elevated today, and has normally been well controlled on her medications in the past. She is taking her medications regularly. I discussed labs with the patient today.  2. Vitamin D deficiency Maymie is on Vit D, and her level is at goal, but lower than previously. I discussed labs with the patient today.  3. Insulin resistance Sandra Henderson's Hgb A1c and glucose are completely normal now since she has been working on diet and weight loss. I discussed labs with the patient today.  4. At risk for heart disease Sandra Henderson is at a higher than average risk for cardiovascular disease due to obesity.   Assessment/Plan:   1. Essential hypertension Shaquita will continue her medications, diet, healthy weight loss, and exercise to improve blood pressure control. We will watch for signs of hypotension as she continues her lifestyle modifications. We will recheck her blood pressure in 3 weeks.  2. Vitamin D deficiency Low Vitamin D level contributes to fatigue and are associated with obesity, breast, and colon cancer. Sandra Henderson agreed to continue taking  prescription Vitamin D 50,000 IU every 14 days and we will refill for 1 month; and start OTC Vit D 1,000 IU daily. We will recheck labs in 3 months and she will follow-up for routine testing of Vitamin D, at least 2-3 times per year to avoid over-replacement.  3. Insulin resistance Sandra Henderson will continue metformin as is, and will continue to work on weight loss, exercise, and decreasing simple carbohydrates to help decrease the risk of diabetes. We will recheck labs in 3 months. Sandra Henderson agreed to follow-up with [place] as directed to closely monitor her progress.  4. At risk for heart disease Sandra Henderson was given approximately 15 minutes of coronary artery disease prevention counseling today. She is 59 y.o. female and has risk factors for heart disease including obesity. We discussed intensive lifestyle modifications today with an emphasis on specific weight loss instructions and strategies.   Repetitive spaced learning was employed today to elicit superior memory formation and behavioral change.  5. Class 2 severe obesity with serious comorbidity and body mass index (BMI) of 35.0 to 35.9 in adult, unspecified obesity type [facility]) Sandra Henderson is currently in the action stage of change. As such, her goal is to continue with weight loss efforts. She has agreed to the Category 2 Plan + 100 calories.   Exercise goals: As is.  Behavioral modification strategies: increasing lean protein intake and holiday eating strategies .  Sandra Henderson has agreed to follow-up with our clinic in 3 to 4 weeks. She was informed of the importance of frequent follow-up visits to maximize her success with intensive lifestyle  modifications for her multiple health conditions.   Objective:   Blood pressure (!) 142/92, pulse 82, temperature 98.8 F (37.1 C), height 5\' 2"  (1.575 m), weight 194 lb (88 kg), last menstrual period 06/12/2018, SpO2 96 %. Body mass index is 35.48 kg/m.  General: Cooperative, alert, well developed, in no acute  distress. HEENT: Conjunctivae and lids unremarkable. Cardiovascular: Regular rhythm.  Lungs: Normal work of breathing. Neurologic: No focal deficits.   Lab Results  Component Value Date   CREATININE 0.62 01/24/2020   BUN 14 01/24/2020   NA 143 01/24/2020   K 4.4 01/24/2020   CL 102 01/24/2020   CO2 28 01/24/2020   Lab Results  Component Value Date   ALT 29 01/24/2020   AST 24 01/24/2020   ALKPHOS 81 01/24/2020   BILITOT 0.5 01/24/2020   Lab Results  Component Value Date   HGBA1C 5.0 01/24/2020   HGBA1C 5.3 10/19/2019   HGBA1C 5.4 07/05/2019   HGBA1C 5.7 (H) 09/27/2014   Lab Results  Component Value Date   INSULIN 20.8 01/24/2020   INSULIN 23.8 10/19/2019   INSULIN 31.3 (H) 07/05/2019   Lab Results  Component Value Date   TSH 1.380 07/05/2019   Lab Results  Component Value Date   CHOL 211 (H) 01/24/2020   HDL 53 01/24/2020   LDLCALC 134 (H) 01/24/2020   TRIG 136 01/24/2020   CHOLHDL 5.5 09/27/2014   Lab Results  Component Value Date   WBC 7.9 07/05/2019   HGB 15.5 07/05/2019   HCT 44.6 07/05/2019   MCV 93 07/05/2019   PLT 398 07/05/2019   No results found for: IRON, TIBC, FERRITIN  Attestation Statements:   Reviewed by clinician on day of visit: allergies, medications, problem list, medical history, surgical history, family history, social history, and previous encounter notes.   I, 07/07/2019, am acting as transcriptionist for Burt Knack, MD.  I have reviewed the above documentation for accuracy and completeness, and I agree with the above. -  Quillian Quince, MD

## 2020-02-28 ENCOUNTER — Other Ambulatory Visit: Payer: Self-pay | Admitting: *Deleted

## 2020-02-28 DIAGNOSIS — I739 Peripheral vascular disease, unspecified: Secondary | ICD-10-CM

## 2020-03-02 DIAGNOSIS — M25561 Pain in right knee: Secondary | ICD-10-CM | POA: Diagnosis not present

## 2020-03-08 DIAGNOSIS — S83232D Complex tear of medial meniscus, current injury, left knee, subsequent encounter: Secondary | ICD-10-CM | POA: Diagnosis not present

## 2020-03-08 DIAGNOSIS — M25561 Pain in right knee: Secondary | ICD-10-CM | POA: Diagnosis not present

## 2020-03-14 ENCOUNTER — Ambulatory Visit (INDEPENDENT_AMBULATORY_CARE_PROVIDER_SITE_OTHER): Payer: BC Managed Care – PPO | Admitting: Family Medicine

## 2020-03-15 ENCOUNTER — Other Ambulatory Visit: Payer: Self-pay

## 2020-03-15 ENCOUNTER — Ambulatory Visit (HOSPITAL_COMMUNITY)
Admission: RE | Admit: 2020-03-15 | Discharge: 2020-03-15 | Disposition: A | Discharge status: Home / Self Care | Payer: BC Managed Care – PPO | Source: Ambulatory Visit | Attending: Vascular Surgery | Admitting: Vascular Surgery

## 2020-03-15 ENCOUNTER — Encounter: Payer: Self-pay | Admitting: Vascular Surgery

## 2020-03-15 ENCOUNTER — Ambulatory Visit (INDEPENDENT_AMBULATORY_CARE_PROVIDER_SITE_OTHER): Payer: BC Managed Care – PPO | Admitting: Vascular Surgery

## 2020-03-15 VITALS — BP 144/94 | HR 76 | Temp 98.2°F | Resp 20 | Ht 62.0 in | Wt 201.9 lb

## 2020-03-15 DIAGNOSIS — M7989 Other specified soft tissue disorders: Secondary | ICD-10-CM

## 2020-03-15 DIAGNOSIS — I739 Peripheral vascular disease, unspecified: Secondary | ICD-10-CM | POA: Diagnosis not present

## 2020-03-15 NOTE — Progress Notes (Signed)
Referring Physician: Dr. Ranell Patrick  Patient name: Sandra Henderson MRN: [id number] DOB: [birth year] Sex: female  REASON FOR CONSULT: Left leg swelling  HPI: Sandra Henderson is a 59 y.o. female, referred for evaluation of calf and ankle swelling.  This has been present for several years.  She has had multiple procedures done on her left knee.  She does not recall ever having a DVT.  She has no family history of abdominal aortic aneurysm.  She does not really describe claudication.  She does have some numbness and tingling that occurs in her right leg when standing still for long periods of time.  She also has chronic right foot numbness.  She has been diagnosed with elevated hemoglobin A1c but states that this improved after significant weight loss.  She is on a statin.Other medical problems include hypertension which is stable.  Past Medical History:  Diagnosis Date  . Anxiety   . Back pain   . Chest pain   . Depression   . Edema, lower extremity   . Family history of adverse reaction to anesthesia    "Mom's blood pressure bottoms out"   . Gallbladder disease   . GERD (gastroesophageal reflux disease)   . Hemorrhoid   . Hyperlipidemia   . Hypertension   . IBS (irritable bowel syndrome)   . Joint pain   . Kidney stones   . Obesity   . Rheumatoid arthritis (HCC)   . Shortness of breath   . SUI (stress urinary incontinence, female)   . Vitamin D deficiency    Past Surgical History:  Procedure Laterality Date  . CHOLECYSTECTOMY OPEN  ~ 1988  . COLONOSCOPY WITH PROPOFOL N/A 06/19/2015   Procedure: COLONOSCOPY WITH PROPOFOL;  Surgeon: Charolett Bumpers, MD;  Location: [facility];  Service: Endoscopy;  Laterality: N/A;  . ESOPHAGOGASTRODUODENOSCOPY (EGD) WITH PROPOFOL N/A 06/19/2015   Procedure: ESOPHAGOGASTRODUODENOSCOPY (EGD) WITH PROPOFOL;  Surgeon: Charolett Bumpers, MD;  Location: [facility];  Service: Endoscopy;  Laterality: N/A;  . TONSILLECTOMY  1960's    Family History    Problem Relation Age of Onset  . Cancer Mother   . Hypertension Mother   . Hyperlipidemia Mother   . Diabetes Mother   . Sleep apnea Mother   . Thyroid disease Mother   . Obesity Mother   . Cancer Father        STOMACH  . Cancer Daughter     SOCIAL HISTORY: Social History   Socioeconomic History  . Marital status: Married    Spouse name: Not on file  . Number of children: Not on file  . Years of education: Not on file  . Highest education level: Not on file  Occupational History  . Occupation: RETIRED   Tobacco Use  . Smoking status: Never Smoker  . Smokeless tobacco: Never Used  Vaping Use  . Vaping Use: Never used  Substance and Sexual Activity  . Alcohol use: Yes    Alcohol/week: 22.0 standard drinks    Types: 12 Glasses of wine, 1 Shots of liquor, 9 Standard drinks or equivalent per week  . Drug use: No  . Sexual activity: Yes  Other Topics Concern  . Not on file  Social History Narrative  . Not on file   Social Determinants of Health   Financial Resource Strain:   . Difficulty of Paying Living Expenses: Not on file  Food Insecurity:   . Worried About Programme researcher, broadcasting/film/video in the Last Year: Not  on file  . Ran Out of Food in the Last Year: Not on file  Transportation Needs:   . Lack of Transportation (Medical): Not on file  . Lack of Transportation (Non-Medical): Not on file  Physical Activity:   . Days of Exercise per Week: Not on file  . Minutes of Exercise per Session: Not on file  Stress:   . Feeling of Stress : Not on file  Social Connections:   . Frequency of Communication with Friends and Family: Not on file  . Frequency of Social Gatherings with Friends and Family: Not on file  . Attends Religious Services: Not on file  . Active Member of Clubs or Organizations: Not on file  . Attends Banker Meetings: Not on file  . Marital Status: Not on file  Intimate Partner Violence:   . Fear of Current or Ex-Partner: Not on file  .  Emotionally Abused: Not on file  . Physically Abused: Not on file  . Sexually Abused: Not on file    No Known Allergies  Current Outpatient Medications  Medication Sig Dispense Refill  . ALPRAZolam (XANAX) 1 MG tablet Take 0.5 mg by mouth daily as needed for anxiety.     Marland Kitchen amLODipine-olmesartan (AZOR) 5-20 MG per tablet Take 1 tablet by mouth daily.    . AZO-CRANBERRY PO Take 2 tablets by mouth daily.    . Ferrous Gluconate (IRON) 240 (27 Fe) MG TABS Take 1 tablet by mouth daily.    . hydrochlorothiazide (HYDRODIURIL) 12.5 MG tablet Take 1 tablet (12.5 mg total) by mouth daily. 30 tablet 0  . ibuprofen (ADVIL,MOTRIN) 200 MG tablet Take 400 mg by mouth every 6 (six) hours as needed (Pain).    . metFORMIN (GLUCOPHAGE) 500 MG tablet TAKE 1 TABLET(500 MG) BY MOUTH EVERY MORNING 90 tablet 0  . Multiple Vitamins-Minerals (CENTRUM SILVER 50+WOMEN PO) Take 1 tablet by mouth daily.    Marland Kitchen Nystatin (NYAMYC) 100000 UNIT/GM POWD Apply 1 application topically daily as needed (Rash).   0  . nystatin cream (MYCOSTATIN) Apply 1 application topically daily as needed (Rash).     . Omega-3 Fatty Acids (FISH OIL) 1200 MG CAPS Take 1 capsule by mouth daily.    Marland Kitchen omeprazole (PRILOSEC) 20 MG capsule Take 20 mg by mouth daily.    . Potassium Gluconate 2 MEQ TABS Take 1 tablet by mouth daily.    . simvastatin (ZOCOR) 20 MG tablet Take 20 mg by mouth daily.    . Vitamin D, Ergocalciferol, (DRISDOL) 1.25 MG (50000 UNIT) CAPS capsule Take 1 capsule (50,000 Units total) by mouth every 14 (fourteen) days. 4 capsule 0   No current facility-administered medications for this visit.    ROS:   General:  No weight loss, Fever, chills  HEENT: No recent headaches, no nasal bleeding, no visual changes, no sore throat  Neurologic: No dizziness, blackouts, seizures. No recent symptoms of stroke or mini- stroke. No recent episodes of slurred speech, or temporary blindness.  Cardiac: No recent episodes of chest  pain/pressure, no shortness of breath at rest.  No shortness of breath with exertion.  Denies history of atrial fibrillation or irregular heartbeat  Vascular: No history of rest pain in feet.  No history of claudication.  No history of non-healing ulcer, No history of DVT   Pulmonary: No home oxygen, no productive cough, no hemoptysis,  No asthma or wheezing  Musculoskeletal:  [X]  Arthritis, [ ]  Low back pain,  [X]  Joint pain  Hematologic:No  history of hypercoagulable state.  No history of easy bleeding.  No history of anemia  Gastrointestinal: No hematochezia or melena,  No gastroesophageal reflux, no trouble swallowing  Urinary: [ ]  chronic Kidney disease, [ ]  on HD - [ ]  MWF or [ ]  TTHS, [ ]  Burning with urination, [ ]  Frequent urination, [ ]  Difficulty urinating;   Skin: No rashes  Psychological: No history of anxiety,  No history of depression   Physical Examination  Vitals:   03/15/20 1224  BP: (!) 144/94  Pulse: 76  Resp: 20  Temp: 98.2 F (36.8 C)  SpO2: 97%  Weight: 201 lb 14.4 oz (91.6 kg)  Height: 5\' 2"  (1.575 m)    Body mass index is 36.93 kg/m.  General:  Alert and oriented, no acute distress HEENT: Normal Neck: No bruit or JVD Pulmonary: Clear to auscultation bilaterally Cardiac: Regular Rate and Rhythm without murmur Abdomen: Soft, non-tender, non-distended, no mass, no scars Skin: No rash Extremity Pulses:  2+femoral, absent right 2+ left dorsalis pedis, absent right 2+ left posterior tibial pulses bilaterally Musculoskeletal: No deformity left leg edema approximately 20% larger than the right leg extending from the knee down to the ankle  Neurologic: Upper and lower extremity motor 5/5 and symmetric  DATA:  Patient had bilateral ABIs performed today which were greater than 1 normal and triphasic bilaterally.  She had normal toe pressure of 150.  ASSESSMENT: Chronic left leg swelling.  No evidence of arterial occlusive disease.  She does not have  palpable pulses in the right foot but she does have normal ABIs.  I discussed with her today continued management of her hemoglobin A1c to prevent tibial disease down the road.  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  Her symptoms are chronic and not suggestive of an acute DVT.  She certainly could have had a DVT in the past but does not really give a history consistent with that.  She may also have a component of venous reflux in the left leg.  I discussed with her the pathophysiology of venous disease as well as the possibility of bringing her back for a venous reflux exam.  She primarily wanted reassurance regarding the left leg swelling and states that this is tolerable.   PLAN: She was given a prescription today for left lower extremity compression stocking.  She will think about whether or not she wishes to pursue this.  She will decide whether or not the swelling is more bothersome or the stocking.  If she decides the left leg swelling is more bothersome to her she will return at some point in the future for reflux exam for further evaluation of her veins.  Otherwise she will follow up on an as-needed basis.   , MD [facility] Office: [phone]

## 2020-03-29 ENCOUNTER — Other Ambulatory Visit: Payer: Self-pay

## 2020-03-29 ENCOUNTER — Ambulatory Visit (INDEPENDENT_AMBULATORY_CARE_PROVIDER_SITE_OTHER): Payer: BC Managed Care – PPO | Admitting: Family Medicine

## 2020-03-29 ENCOUNTER — Encounter (INDEPENDENT_AMBULATORY_CARE_PROVIDER_SITE_OTHER): Payer: Self-pay | Admitting: Family Medicine

## 2020-03-29 VITALS — BP 126/80 | HR 83 | Temp 97.8°F | Ht 62.0 in | Wt 194.0 lb

## 2020-03-29 DIAGNOSIS — E559 Vitamin D deficiency, unspecified: Secondary | ICD-10-CM

## 2020-03-29 DIAGNOSIS — Z6835 Body mass index (BMI) 35.0-35.9, adult: Secondary | ICD-10-CM

## 2020-03-29 MED ORDER — VITAMIN D (ERGOCALCIFEROL) 1.25 MG (50000 UNIT) PO CAPS: 50000.0000 [IU] | ORAL_CAPSULE | ORAL | 0 refills | Status: DC

## 2020-04-02 DIAGNOSIS — E7800 Pure hypercholesterolemia, unspecified: Secondary | ICD-10-CM | POA: Diagnosis not present

## 2020-04-02 DIAGNOSIS — I10 Essential (primary) hypertension: Secondary | ICD-10-CM | POA: Diagnosis not present

## 2020-04-02 DIAGNOSIS — Z0000 Encounter for general adult medical examination without abnormal findings: Secondary | ICD-10-CM | POA: Diagnosis not present

## 2020-04-02 DIAGNOSIS — E559 Vitamin D deficiency, unspecified: Secondary | ICD-10-CM | POA: Diagnosis not present

## 2020-04-02 NOTE — Progress Notes (Signed)
Chief Complaint:   OBESITY Sandra Henderson is here to discuss her progress with her obesity treatment plan along with follow-up of her obesity related diagnoses. Sandra Henderson is on the Category 2 Plan + 100 calories and states she is following her eating plan approximately 70% of the time. Sandra Henderson states she is walking 6,000-8,000 steps daily.  Today's visit was #: 13 Starting weight: 216 lbs Starting date: 07/05/2019 Today's weight: 194 lbs Today's date: 03/29/2020 Total lbs lost to date: 22 Total lbs lost since last in-office visit: 0  Interim History: Sandra Henderson has done very well maintaining her weight, even with increased traveling and eating out. She made good damaged control strategies and she is open to discussing Thanksgiving eating strategies.  Subjective:   1. Vitamin D deficiency Sandra Henderson is on Vit D prescription every other week. She requests a refill today.  Assessment/Plan:   1. Vitamin D deficiency Low Vitamin D level contributes to fatigue and are associated with obesity, breast, and colon cancer. We will refill prescription Vitamin D for 1 month. Sandra Henderson will follow-up for routine testing of Vitamin D, at least 2-3 times per year to avoid over-replacement.  - Vitamin D, Ergocalciferol, (DRISDOL) 1.25 MG (50000 UNIT) CAPS capsule; Take 1 capsule (50,000 Units total) by mouth every 14 (fourteen) days.  Dispense: 4 capsule; Refill: 0  2. Class 2 severe obesity with serious comorbidity and body mass index (BMI) of 35.0 to 35.9 in adult, unspecified obesity type [facility]) Noya is currently in the action stage of change. As such, her goal is to continue with weight loss efforts. She has agreed to the Category 2 Plan + 100 calories.   Exercise goals: As is.  Behavioral modification strategies: meal planning and cooking strategies and holiday eating strategies .  Sandra Henderson has agreed to follow-up with our clinic in 4 weeks. She was informed of the importance of frequent follow-up visits to  maximize her success with intensive lifestyle modifications for her multiple health conditions.   Objective:   Blood pressure 126/80, pulse 83, temperature 97.8 F (36.6 C), height 5\' 2"  (1.575 m), weight 194 lb (88 kg), last menstrual period 06/12/2018, SpO2 97 %. Body mass index is 35.48 kg/m.  General: Cooperative, alert, well developed, in no acute distress. HEENT: Conjunctivae and lids unremarkable. Cardiovascular: Regular rhythm.  Lungs: Normal work of breathing. Neurologic: No focal deficits.   Lab Results  Component Value Date   CREATININE 0.62 01/24/2020   BUN 14 01/24/2020   NA 143 01/24/2020   K 4.4 01/24/2020   CL 102 01/24/2020   CO2 28 01/24/2020   Lab Results  Component Value Date   ALT 29 01/24/2020   AST 24 01/24/2020   ALKPHOS 81 01/24/2020   BILITOT 0.5 01/24/2020   Lab Results  Component Value Date   HGBA1C 5.0 01/24/2020   HGBA1C 5.3 10/19/2019   HGBA1C 5.4 07/05/2019   HGBA1C 5.7 (H) 09/27/2014   Lab Results  Component Value Date   INSULIN 20.8 01/24/2020   INSULIN 23.8 10/19/2019   INSULIN 31.3 (H) 07/05/2019   Lab Results  Component Value Date   TSH 1.380 07/05/2019   Lab Results  Component Value Date   CHOL 211 (H) 01/24/2020   HDL 53 01/24/2020   LDLCALC 134 (H) 01/24/2020   TRIG 136 01/24/2020   CHOLHDL 5.5 09/27/2014   Lab Results  Component Value Date   WBC 7.9 07/05/2019   HGB 15.5 07/05/2019   HCT 44.6 07/05/2019   MCV 93  07/05/2019   PLT 398 07/05/2019   No results found for: IRON, TIBC, FERRITIN  Attestation Statements:   Reviewed by clinician on day of visit: allergies, medications, problem list, medical history, surgical history, family history, social history, and previous encounter notes.   I, Burt Knack, am acting as transcriptionist for Quillian Quince, MD.  I have reviewed the above documentation for accuracy and completeness, and I agree with the above. -  Quillian Quince, MD

## 2020-04-19 ENCOUNTER — Encounter (INDEPENDENT_AMBULATORY_CARE_PROVIDER_SITE_OTHER): Payer: Self-pay | Admitting: Family Medicine

## 2020-04-25 ENCOUNTER — Ambulatory Visit (INDEPENDENT_AMBULATORY_CARE_PROVIDER_SITE_OTHER): Payer: BC Managed Care – PPO | Admitting: Family Medicine

## 2020-04-30 ENCOUNTER — Other Ambulatory Visit (INDEPENDENT_AMBULATORY_CARE_PROVIDER_SITE_OTHER): Payer: Self-pay | Admitting: Family Medicine

## 2020-04-30 DIAGNOSIS — E559 Vitamin D deficiency, unspecified: Secondary | ICD-10-CM

## 2020-04-30 DIAGNOSIS — E8881 Metabolic syndrome: Secondary | ICD-10-CM

## 2020-04-30 MED ORDER — VITAMIN D (ERGOCALCIFEROL) 1.25 MG (50000 UNIT) PO CAPS: 50000.0000 [IU] | ORAL_CAPSULE | ORAL | 0 refills | Status: DC

## 2020-04-30 MED ORDER — METFORMIN HCL 500 MG PO TABS: ORAL_TABLET | ORAL | 0 refills | Status: DC

## 2020-04-30 NOTE — Telephone Encounter (Signed)
Refill request given to MD 

## 2020-05-21 ENCOUNTER — Ambulatory Visit (INDEPENDENT_AMBULATORY_CARE_PROVIDER_SITE_OTHER): Payer: BC Managed Care – PPO | Admitting: Family Medicine

## 2020-07-03 ENCOUNTER — Other Ambulatory Visit (INDEPENDENT_AMBULATORY_CARE_PROVIDER_SITE_OTHER): Payer: Self-pay | Admitting: Family Medicine

## 2020-07-03 DIAGNOSIS — E8881 Metabolic syndrome: Secondary | ICD-10-CM

## 2020-07-03 NOTE — Telephone Encounter (Signed)
Dr.Beasley 

## 2020-11-08 ENCOUNTER — Ambulatory Visit: Payer: BC Managed Care – PPO | Admitting: Orthopedic Surgery

## 2020-11-13 ENCOUNTER — Ambulatory Visit: Payer: Self-pay

## 2020-11-13 ENCOUNTER — Ambulatory Visit (INDEPENDENT_AMBULATORY_CARE_PROVIDER_SITE_OTHER): Payer: 59 | Admitting: Orthopedic Surgery

## 2020-11-13 ENCOUNTER — Ambulatory Visit (INDEPENDENT_AMBULATORY_CARE_PROVIDER_SITE_OTHER): Payer: 59

## 2020-11-13 ENCOUNTER — Encounter: Payer: Self-pay | Admitting: Orthopedic Surgery

## 2020-11-13 DIAGNOSIS — M76821 Posterior tibial tendinitis, right leg: Secondary | ICD-10-CM

## 2020-11-13 DIAGNOSIS — G8929 Other chronic pain: Secondary | ICD-10-CM | POA: Diagnosis not present

## 2020-11-13 DIAGNOSIS — M48062 Spinal stenosis, lumbar region with neurogenic claudication: Secondary | ICD-10-CM

## 2020-11-13 DIAGNOSIS — M76822 Posterior tibial tendinitis, left leg: Secondary | ICD-10-CM | POA: Diagnosis not present

## 2020-11-13 DIAGNOSIS — M25571 Pain in right ankle and joints of right foot: Secondary | ICD-10-CM | POA: Diagnosis not present

## 2020-11-13 DIAGNOSIS — M25572 Pain in left ankle and joints of left foot: Secondary | ICD-10-CM | POA: Diagnosis not present

## 2020-11-13 DIAGNOSIS — M5441 Lumbago with sciatica, right side: Secondary | ICD-10-CM | POA: Diagnosis not present

## 2020-11-13 NOTE — Progress Notes (Signed)
Office Visit Note   Patient: Sandra Henderson           Date of Birth: 07/07/1960           MRN: [id number] Visit Date: 11/13/2020              Requested by: Georgann Housekeeper, MD [address] [place],  [place] [address] PCP: Georgann Housekeeper, MD  Chief Complaint  Patient presents with   Lower Back - Pain   Left Foot - Pain   Right Foot - Pain   Left Ankle - Pain      HPI: Patient is a 60 year old woman who has right-sided radicular pain from the right thigh down to the lateral aspect of her right foot.  Patient states that she did step on a stick on the right foot in 2019 and the numbness on the lateral aspect of the right foot seems to be associated with this blunt trauma.  Patient states that after prolonged standing the radicular pain in the right lower extremity does get worse it improves with sitting and improves with forward flexion such as pushing a cart.  Patient states she has increasing pronation and valgus deformity of both feet with pain over the posterior tibial tendon bilaterally.  Patient has seen a vascular doctor and was told she had lymphedema.  Assessment & Plan: Visit Diagnoses:  1. Chronic right-sided low back pain with right-sided sciatica   2. Pain in left ankle and joints of left foot   3. Pain in right ankle and joints of right foot   4. Posterior tibial tendinitis, left leg   5. Posterior tibial tendinitis, right leg   6. Spinal stenosis of lumbar region with neurogenic claudication     Plan: Patient does have custom orthotics recommended she use the custom orthotics with a stiff soled sneaker to unload the posterior tibial tendon as well as using Voltaren gel over the posterior tibial tendon.  We will set her up for physical therapy for her spinal stenosis right-sided radicular pain and neurogenic claudication and follow-up in 4 weeks.  If were not showing any improvement with her symptoms we will set up an MRI scan.  Follow-Up Instructions:  Return in about 4 weeks (around 12/11/2020).   Ortho Exam  Patient is alert, oriented, no adenopathy, well-dressed, normal affect, normal respiratory effort. Examination patient does have pronation and valgus of both feet swelling over the posterior tibial tendon bilaterally.  She can do a single limb heel raise but this is uncomfortable.  She has a negative straight leg raise bilaterally with no focal motor weakness in either lower extremity.  Patient has neurogenic claudication with spinal stenosis symptoms.  There is venous insufficiency of both legs with swelling without brawny edema or ulceration.  Imaging: No results found. No images are attached to the encounter.  Labs: Lab Results  Component Value Date   HGBA1C 5.0 01/24/2020   HGBA1C 5.3 10/19/2019   HGBA1C 5.4 07/05/2019     Lab Results  Component Value Date   ALBUMIN 4.8 01/24/2020   ALBUMIN 4.8 10/19/2019   ALBUMIN 4.9 07/05/2019    No results found for: MG Lab Results  Component Value Date   VD25OH 59.7 01/24/2020   VD25OH 69.3 10/19/2019   VD25OH 52.8 07/05/2019    No results found for: PREALBUMIN CBC EXTENDED Latest Ref Rng & Units 07/05/2019 09/26/2014  WBC 3.4 - 10.8 x10E3/uL 7.9 8.2  RBC 3.77 - 5.28 x10E6/uL 4.79 4.71  HGB 11.1 - 15.9 g/dL 54.6 50.3  HCT 54.6 - 56.8 % 44.6 40.7  PLT 150 - 450 x10E3/uL 398 443(H)  NEUTROABS 1.4 - 7.0 x10E3/uL 5.0 4.8  LYMPHSABS 0.7 - 3.1 x10E3/uL 2.1 2.8     There is no height or weight on file to calculate BMI.  Orders:  Orders Placed This Encounter  Procedures   XR Lumbar Spine 2-3 Views   XR Ankle 2 Views Left   XR Foot 2 Views Right   XR Ankle 2 Views Right   XR Foot 2 Views Left   No orders of the defined types were placed in this encounter.    Procedures: No procedures performed  Clinical Data: No additional findings.  ROS:  All other systems negative, except as noted in the HPI. Review of Systems  Objective: Vital Signs: LMP 06/12/2018  (Exact Date)   Specialty Comments:  No specialty comments available.  PMFS History: Patient Active Problem List   Diagnosis Date Noted   Vitamin D deficiency 02/20/2020   Insulin resistance 02/20/2020   At risk for heart disease 02/20/2020   Prediabetes 12/12/2019   Sensory disturbance 09/26/2014   Essential hypertension 09/26/2014   Alcohol dependence (HCC) 09/26/2014   Hyperlipidemia 09/26/2014   Hemorrhoids, external 06/04/2011   Prolapsed internal hemorrhoids 06/04/2011   Past Medical History:  Diagnosis Date   Anxiety    Back pain    Chest pain    Depression    Edema, lower extremity    Family history of adverse reaction to anesthesia    "Mom's blood pressure bottoms out"    Gallbladder disease    GERD (gastroesophageal reflux disease)    Hemorrhoid    Hyperlipidemia    Hypertension    IBS (irritable bowel syndrome)    Joint pain    Kidney stones    Obesity    Rheumatoid arthritis (HCC)    Shortness of breath    SUI (stress urinary incontinence, female)    Vitamin D deficiency     Family History  Problem Relation Age of Onset   Cancer Mother    Hypertension Mother    Hyperlipidemia Mother    Diabetes Mother    Sleep apnea Mother    Thyroid disease Mother    Obesity Mother    Cancer Father        STOMACH   Cancer Daughter     Past Surgical History:  Procedure Laterality Date   CHOLECYSTECTOMY OPEN  ~ 1988   COLONOSCOPY WITH PROPOFOL N/A 06/19/2015   Procedure: COLONOSCOPY WITH PROPOFOL;  Surgeon: Charolett Bumpers, MD;  Location: [facility];  Service: Endoscopy;  Laterality: N/A;   ESOPHAGOGASTRODUODENOSCOPY (EGD) WITH PROPOFOL N/A 06/19/2015   Procedure: ESOPHAGOGASTRODUODENOSCOPY (EGD) WITH PROPOFOL;  Surgeon: Charolett Bumpers, MD;  Location: [facility];  Service: Endoscopy;  Laterality: N/A;   TONSILLECTOMY  1960's   Social History   Occupational History   Occupation: RETIRED   Tobacco Use   Smoking status: Never   Smokeless tobacco: Never   Vaping Use   Vaping Use: Never used  Substance and Sexual Activity   Alcohol use: Yes    Alcohol/week: 22.0 standard drinks    Types: 12 Glasses of wine, 1 Shots of liquor, 9 Standard drinks or equivalent per week   Drug use: No   Sexual activity: Yes

## 2020-12-25 ENCOUNTER — Ambulatory Visit: Payer: 59 | Admitting: Orthopedic Surgery

## 2021-01-21 ENCOUNTER — Other Ambulatory Visit (INDEPENDENT_AMBULATORY_CARE_PROVIDER_SITE_OTHER): Payer: Self-pay | Admitting: Family Medicine

## 2021-01-21 DIAGNOSIS — I10 Essential (primary) hypertension: Secondary | ICD-10-CM

## 2021-01-21 NOTE — Telephone Encounter (Signed)
Dr.Beasley 

## 2021-05-16 ENCOUNTER — Emergency Department (HOSPITAL_COMMUNITY)
Admission: EM | Admit: 2021-05-16 | Discharge: 2021-05-17 | Disposition: A | Discharge status: Home / Self Care | Payer: Commercial Managed Care - HMO | Attending: Emergency Medicine | Admitting: Emergency Medicine

## 2021-05-16 ENCOUNTER — Encounter (HOSPITAL_COMMUNITY): Payer: Self-pay | Admitting: Emergency Medicine

## 2021-05-16 ENCOUNTER — Other Ambulatory Visit: Payer: Self-pay

## 2021-05-16 ENCOUNTER — Emergency Department (HOSPITAL_COMMUNITY): Payer: Commercial Managed Care - HMO

## 2021-05-16 DIAGNOSIS — Z5321 Procedure and treatment not carried out due to patient leaving prior to being seen by health care provider: Secondary | ICD-10-CM | POA: Insufficient documentation

## 2021-05-16 DIAGNOSIS — H21569 Pupillary abnormality, unspecified eye: Secondary | ICD-10-CM | POA: Insufficient documentation

## 2021-05-16 DIAGNOSIS — R519 Headache, unspecified: Secondary | ICD-10-CM | POA: Insufficient documentation

## 2021-05-16 DIAGNOSIS — H538 Other visual disturbances: Secondary | ICD-10-CM | POA: Diagnosis not present

## 2021-05-16 LAB — CBC WITH DIFFERENTIAL/PLATELET
Abs Immature Granulocytes: 0.05 10*3/uL (ref 0.00–0.07)
Basophils Absolute: 0.1 10*3/uL (ref 0.0–0.1)
Basophils Relative: 1 %
Eosinophils Absolute: 0.1 10*3/uL (ref 0.0–0.5)
Eosinophils Relative: 2 %
HCT: 42.7 % (ref 36.0–46.0)
Hemoglobin: 14.4 g/dL (ref 12.0–15.0)
Immature Granulocytes: 1 %
Lymphocytes Relative: 32 %
Lymphs Abs: 2.9 10*3/uL (ref 0.7–4.0)
MCH: 32.1 pg (ref 26.0–34.0)
MCHC: 33.7 g/dL (ref 30.0–36.0)
MCV: 95.1 fL (ref 80.0–100.0)
Monocytes Absolute: 0.6 10*3/uL (ref 0.1–1.0)
Monocytes Relative: 7 %
Neutro Abs: 5.1 10*3/uL (ref 1.7–7.7)
Neutrophils Relative %: 57 %
Platelets: 396 10*3/uL (ref 150–400)
RBC: 4.49 MIL/uL (ref 3.87–5.11)
RDW: 11.8 % (ref 11.5–15.5)
WBC: 8.8 10*3/uL (ref 4.0–10.5)
nRBC: 0 % (ref 0.0–0.2)

## 2021-05-16 LAB — BASIC METABOLIC PANEL
Anion gap: 10 (ref 5–15)
BUN: 12 mg/dL (ref 8–23)
CO2: 24 mmol/L (ref 22–32)
Calcium: 9.8 mg/dL (ref 8.9–10.3)
Chloride: 103 mmol/L (ref 98–111)
Creatinine, Ser: 0.73 mg/dL (ref 0.44–1.00)
GFR, Estimated: >60 mL/min (ref >60)
Glucose, Bld: 91 mg/dL (ref 70–99)
Potassium: 4.1 mmol/L (ref 3.5–5.1)
Sodium: 137 mmol/L (ref 135–145)

## 2021-05-16 NOTE — ED Triage Notes (Signed)
Pt reports around 1500 she noticed her right pupil was dilated "much larger" than her left pupil. Pt states she is unsure whether this was before or after she placed eye drops in her eye. Pt also reports she had blurred vision. Pt denies any neurologic deficits.

## 2021-05-16 NOTE — ED Provider Triage Note (Addendum)
Emergency Medicine Provider Triage Evaluation Note  Sandra Henderson , a 61 y.o. female  was evaluated in triage.  Pt complains of pupil discrepancy.  Patient states that this morning she was putting her contacts in when she had blurry vision.  She states that she pulled her contacts out and noticed that her right pupil was much larger than the left (see media I have attached picture that she took at home).  She states that the discrepancy decreased a little bit but she has had ongoing frontal headache.  She denies any other neuro symptoms.  Denies nausea.  Denies proptosis or sensation of proptosis in the right eye.  She denies pain specifically in the right eye.  Denies loss of vision in the right eye.  Denies fever or head trauma.  She does state that she started Wellbutrin about 10 days ago.  She  Review of Systems  Positive: See above Negative:   Physical Exam  BP (!) 166/104 (BP Location: Right Arm)    Pulse 99    Temp 98.3 F (36.8 C) (Oral)    Resp 16    LMP 06/12/2018 (Exact Date)    SpO2 100%  Gen:   Awake, no distress   Resp:  Normal effort  MSK:   Moves extremities without difficulty  Other:  Right pupil 4 mm, left pupil 3 mm and equally reactive to light and accommodation.  Extraocular movements intact.  No peripheral field deficits.  No nystagmus.  Red reflex present in both eyes.  No other focal neurological deficits.  Temporal arteries within normal limits.  Medical Decision Making  Medically screening exam initiated at 5:43 PM.  Appropriate orders placed.  Sandra Henderson was informed that the remainder of the evaluation will be completed by another provider, this initial triage assessment does not replace that evaluation, and the importance of remaining in the ED until their evaluation is complete.     Cristopher Peru, PA-C 05/16/21 1745    Cristopher Peru, PA-C 05/16/21 1745

## 2021-05-17 NOTE — ED Notes (Signed)
P[atient decided to leave stated that she would follow up at her doctors office

## 2021-05-24 ENCOUNTER — Other Ambulatory Visit: Payer: Self-pay | Admitting: Physician Assistant

## 2021-05-24 ENCOUNTER — Encounter: Payer: Self-pay | Admitting: Neurology

## 2021-05-24 DIAGNOSIS — H539 Unspecified visual disturbance: Secondary | ICD-10-CM

## 2021-05-24 DIAGNOSIS — H5704 Mydriasis: Secondary | ICD-10-CM

## 2021-06-17 ENCOUNTER — Ambulatory Visit
Admission: RE | Admit: 2021-06-17 | Discharge: 2021-06-17 | Disposition: A | Discharge status: Home / Self Care | Payer: Managed Care, Other (non HMO) | Source: Ambulatory Visit | Attending: Physician Assistant | Admitting: Physician Assistant

## 2021-06-17 DIAGNOSIS — H539 Unspecified visual disturbance: Secondary | ICD-10-CM

## 2021-06-17 DIAGNOSIS — H5704 Mydriasis: Secondary | ICD-10-CM

## 2021-07-19 ENCOUNTER — Ambulatory Visit: Payer: Managed Care, Other (non HMO) | Admitting: Neurology

## 2021-07-29 NOTE — Progress Notes (Signed)
? ?NEUROLOGY CONSULTATION NOTE ? ?Sandra Henderson ?MRN: [id number] ?DOB: 03-27-1961 ? ?Referring provider: Thayer Ohm, PA-C ?Primary care provider: Wenda Low, MD ? ?Reason for consult:  unilateral blurred vision/pupillary dilatation ? ?Assessment/Plan:  ? ?Transient right mydriasis and blurred vision.  Unusual presentation for TIA.  Dilated pupil may potentially be seen in midbrain stroke, however she reported worsening diplopia with distance, suggesting 6th nerve/pons.  Another possibility is Benign Episodic Mydriasis ?Hyperlipidemia. ? ?I think I would err on side of caution and treat for TIA. ?Would check cardiac event monitor and obtain results of echocardiogram - further recommendations pending results. ?Secondary stroke prevention as managed by PCP: ?ASA 81mg  daily ?Statin.  LDL goal less than 70 ?Normotensive blood pressure ?Hgb a1c goal less than 7 ?Would recommend ophthalmology evaluation ? ? ?Subjective:  ?Sandra Henderson is a 61 year old female with HTN, HLD, IBS, RA and depression/anxiety who presents for episode of unilateral blurred vision and pupillary dilatation.  History supplemented by referring provider's note. ? ?On 05/16/2020, she was putting in her contact lenses when she developed sudden blurred vision.  She looked in the mirror and noted that her right pupil was dilated.  Distant objects looked double.  She did not try closing either eye to see if blurred/double vision resolved.  She felt slightly dizzy.  No headache, eye pain, ptosis, facial droop, slurred speech or unilateral numbness or weakness.  Other than the drops for her contacts, she did not use any eye drops.  Symptoms lasted 45 minutes.  She had CT of orbits performed that day which as unremarkable.  She had carotid ultrasound which was normal. She said she had an echocardiogram which I do not see.  She takes simvastatin.  MRI and MRA of head on 06/17/2021 personally reviewed were unremarkable. No recurrence.  Denies history of  migraines.  Has not followed up with an eye doctor. ?ASA 81mg   ? ?PAST MEDICAL HISTORY: ?Past Medical History:  ?Diagnosis Date  ? Anxiety   ? Back pain   ? Chest pain   ? Depression   ? Edema, lower extremity   ? Family history of adverse reaction to anesthesia   ? "Mom's blood pressure bottoms out"   ? Gallbladder disease   ? GERD (gastroesophageal reflux disease)   ? Hemorrhoid   ? Hyperlipidemia   ? Hypertension   ? IBS (irritable bowel syndrome)   ? Joint pain   ? Kidney stones   ? Obesity   ? Rheumatoid arthritis ([place])   ? Shortness of breath   ? SUI (stress urinary incontinence, female)   ? Vitamin D deficiency   ? ? ?PAST SURGICAL HISTORY: ?Past Surgical History:  ?Procedure Laterality Date  ? CHOLECYSTECTOMY OPEN  ~ 1988  ? COLONOSCOPY WITH PROPOFOL N/A 06/19/2015  ? Procedure: COLONOSCOPY WITH PROPOFOL;  Surgeon: Garlan Fair, MD;  Location: [facility];  Service: Endoscopy;  Laterality: N/A;  ? ESOPHAGOGASTRODUODENOSCOPY (EGD) WITH PROPOFOL N/A 06/19/2015  ? Procedure: ESOPHAGOGASTRODUODENOSCOPY (EGD) WITH PROPOFOL;  Surgeon: Garlan Fair, MD;  Location: [facility];  Service: Endoscopy;  Laterality: N/A;  ? TONSILLECTOMY  1960's  ? ? ?MEDICATIONS: ?Current Outpatient Medications on File Prior to Visit  ?Medication Sig Dispense Refill  ? acetaminophen (TYLENOL) 325 MG tablet Take 325 mg by mouth every 6 (six) hours as needed for moderate pain or headache.    ? ALPRAZolam (XANAX) 1 MG tablet Take 0.5 mg by mouth daily as needed for anxiety.     ?  amLODipine (NORVASC) 10 MG tablet Take 10 mg by mouth at bedtime.    ? AZO-CRANBERRY PO Take 2 tablets by mouth daily.    ? buPROPion (WELLBUTRIN SR) 150 MG 12 hr tablet Take 150 mg by mouth every morning.    ? Cholecalciferol (QC VITAMIN D3) 50 MCG (2000 UT) TABS Take 2,000 Units by mouth daily.    ? hydrochlorothiazide (HYDRODIURIL) 12.5 MG tablet Take 1 tablet (12.5 mg total) by mouth daily. 30 tablet 0  ? ibuprofen (ADVIL,MOTRIN) 200 MG tablet Take 400  mg by mouth every 6 (six) hours as needed (Pain).    ? metFORMIN (GLUCOPHAGE) 500 MG tablet TAKE 1 TABLET(500 MG) BY MOUTH EVERY MORNING (Patient not taking: Reported on 05/17/2021) 30 tablet 0  ? metFORMIN (GLUCOPHAGE-XR) 500 MG 24 hr tablet Take 500 mg by mouth daily.    ? Multiple Vitamins-Minerals (CENTRUM SILVER 50+WOMEN PO) Take 1 tablet by mouth daily.    ? Nystatin (NYAMYC) 100000 UNIT/GM POWD Apply 1 application topically daily as needed (Rash).   0  ? olmesartan (BENICAR) 40 MG tablet Take 40 mg by mouth at bedtime.    ? Omega-3 Fatty Acids (FISH OIL) 1200 MG CAPS Take 1 capsule by mouth daily.    ? omeprazole (PRILOSEC) 20 MG capsule Take 20 mg by mouth daily.    ? POTASSIUM GLUCONATE PO Take 1 tablet by mouth daily.    ? simvastatin (ZOCOR) 20 MG tablet Take 20 mg by mouth at bedtime.    ? Vitamin D, Ergocalciferol, (DRISDOL) 1.25 MG (50000 UNIT) CAPS capsule Take 1 capsule (50,000 Units total) by mouth every 14 (fourteen) days. (Patient not taking: Reported on 05/17/2021) 4 capsule 0  ? ?No current facility-administered medications on file prior to visit.  ? ? ?ALLERGIES: ?No Known Allergies ? ?FAMILY HISTORY: ?Family History  ?Problem Relation Age of Onset  ? Cancer Mother   ? Hypertension Mother   ? Hyperlipidemia Mother   ? Diabetes Mother   ? Sleep apnea Mother   ? Thyroid disease Mother   ? Obesity Mother   ? Cancer Father   ?     STOMACH  ? Cancer Daughter   ? ? ?Objective:  ?Blood pressure (!) 145/89, pulse 89, height 5\' 3"  (1.6 m), weight 219 lb (99.3 kg), last menstrual period 06/12/2018, SpO2 97 %. ?General: No acute distress.  Patient appears well-groomed.   ?Head:  Normocephalic/atraumatic ?Eyes:  fundi examined but not visualized ?Neck: supple, no paraspinal tenderness, full range of motion ?Back: No paraspinal tenderness ?Heart: regular rate and rhythm ?Lungs: Clear to auscultation bilaterally. ?Vascular: No carotid bruits. ?Neurological Exam: ?Mental status: alert and oriented to person,  place, and time, recent and remote memory intact, fund of knowledge intact, attention and concentration intact, speech fluent and not dysarthric, language intact. ?Cranial nerves: ?CN I: not tested ?CN II: pupils equal, round and reactive to light, visual fields intact ?CN III, IV, VI:  full range of motion, no nystagmus, no ptosis ?CN V: facial sensation intact. ?CN VII: upper and lower face symmetric ?CN VIII: hearing intact ?CN IX, X: gag intact, uvula midline ?CN XI: sternocleidomastoid and trapezius muscles intact ?CN XII: tongue midline ?Bulk & Tone: normal, no fasciculations. ?Motor:  muscle strength 5/5 throughout ?Sensation:  Pinprick, temperature and vibratory sensation intact. ?Deep Tendon Reflexes:  2+ throughout,  toes downgoing.   ?Finger to nose testing:  Without dysmetria.   ?Heel to shin:  Without dysmetria.   ?Gait:  Normal station and  stride.  Romberg negative. ? ? ? ?Thank you for allowing me to take part in the care of this patient. ? ?Metta Clines, DO ? ?CC: Olivia Clelland, PA-C ? Wenda Low, MD ? ? ? ? ?

## 2021-07-30 ENCOUNTER — Ambulatory Visit: Payer: Managed Care, Other (non HMO) | Admitting: Neurology

## 2021-07-30 ENCOUNTER — Encounter: Payer: Self-pay | Admitting: Neurology

## 2021-07-30 ENCOUNTER — Other Ambulatory Visit: Payer: Self-pay

## 2021-07-30 VITALS — BP 145/89 | HR 89 | Ht 63.0 in | Wt 219.0 lb

## 2021-07-30 DIAGNOSIS — G459 Transient cerebral ischemic attack, unspecified: Secondary | ICD-10-CM | POA: Diagnosis not present

## 2021-07-30 NOTE — Patient Instructions (Signed)
Continue aspirin 81mg  daily ?Continue statin therapy ?Will get results of echocardiogram ?Will check 2 week cardiac event monitor ?Further recommendations pending results. ?

## 2021-10-22 ENCOUNTER — Ambulatory Visit: Payer: Managed Care, Other (non HMO) | Admitting: Neurology

## 2021-12-18 ENCOUNTER — Encounter (INDEPENDENT_AMBULATORY_CARE_PROVIDER_SITE_OTHER): Payer: Self-pay

## 2022-04-28 ENCOUNTER — Other Ambulatory Visit: Payer: Self-pay | Admitting: Obstetrics and Gynecology

## 2022-04-28 DIAGNOSIS — Z13820 Encounter for screening for osteoporosis: Secondary | ICD-10-CM

## 2022-06-17 ENCOUNTER — Other Ambulatory Visit: Payer: Commercial Managed Care - HMO

## 2022-07-15 ENCOUNTER — Other Ambulatory Visit: Payer: Commercial Managed Care - HMO

## 2022-12-25 ENCOUNTER — Ambulatory Visit
Admission: RE | Admit: 2022-12-25 | Discharge: 2022-12-25 | Disposition: A | Discharge status: Home / Self Care | Payer: Commercial Managed Care - HMO | Source: Ambulatory Visit | Attending: Obstetrics and Gynecology | Admitting: Obstetrics and Gynecology

## 2022-12-25 DIAGNOSIS — Z13820 Encounter for screening for osteoporosis: Secondary | ICD-10-CM

## 2023-05-18 ENCOUNTER — Other Ambulatory Visit: Payer: Self-pay | Admitting: Obstetrics and Gynecology

## 2023-05-18 DIAGNOSIS — Z1231 Encounter for screening mammogram for malignant neoplasm of breast: Secondary | ICD-10-CM

## 2023-06-15 ENCOUNTER — Ambulatory Visit
Admission: RE | Admit: 2023-06-15 | Discharge: 2023-06-15 | Disposition: A | Discharge status: Home / Self Care | Payer: Commercial Managed Care - HMO | Source: Ambulatory Visit | Attending: Obstetrics and Gynecology | Admitting: Obstetrics and Gynecology

## 2023-06-15 DIAGNOSIS — Z1231 Encounter for screening mammogram for malignant neoplasm of breast: Secondary | ICD-10-CM

## 2023-09-14 ENCOUNTER — Ambulatory Visit
Admission: RE | Admit: 2023-09-14 | Discharge: 2023-09-14 | Disposition: A | Discharge status: Home / Self Care | Source: Ambulatory Visit | Attending: Internal Medicine | Admitting: Internal Medicine

## 2023-09-14 ENCOUNTER — Other Ambulatory Visit: Payer: Self-pay

## 2023-09-14 ENCOUNTER — Emergency Department (HOSPITAL_BASED_OUTPATIENT_CLINIC_OR_DEPARTMENT_OTHER)

## 2023-09-14 ENCOUNTER — Inpatient Hospital Stay (HOSPITAL_BASED_OUTPATIENT_CLINIC_OR_DEPARTMENT_OTHER)
Admission: EM | Admit: 2023-09-14 | Discharge: 2023-09-17 | DRG: 683 | Disposition: A | Discharge status: Home / Self Care | Attending: Internal Medicine | Admitting: Internal Medicine

## 2023-09-14 ENCOUNTER — Encounter (HOSPITAL_BASED_OUTPATIENT_CLINIC_OR_DEPARTMENT_OTHER): Payer: Self-pay | Admitting: Emergency Medicine

## 2023-09-14 VITALS — BP 122/82 | HR 72 | Temp 98.4°F | Resp 18

## 2023-09-14 DIAGNOSIS — K581 Irritable bowel syndrome with constipation: Secondary | ICD-10-CM | POA: Diagnosis not present

## 2023-09-14 DIAGNOSIS — E7800 Pure hypercholesterolemia, unspecified: Secondary | ICD-10-CM | POA: Diagnosis not present

## 2023-09-14 DIAGNOSIS — Z7982 Long term (current) use of aspirin: Secondary | ICD-10-CM | POA: Diagnosis not present

## 2023-09-14 DIAGNOSIS — Z83438 Family history of other disorder of lipoprotein metabolism and other lipidemia: Secondary | ICD-10-CM | POA: Diagnosis not present

## 2023-09-14 DIAGNOSIS — Z6838 Body mass index (BMI) 38.0-38.9, adult: Secondary | ICD-10-CM | POA: Diagnosis not present

## 2023-09-14 DIAGNOSIS — Z8249 Family history of ischemic heart disease and other diseases of the circulatory system: Secondary | ICD-10-CM | POA: Diagnosis not present

## 2023-09-14 DIAGNOSIS — E872 Acidosis, unspecified: Secondary | ICD-10-CM | POA: Diagnosis not present

## 2023-09-14 DIAGNOSIS — I10 Essential (primary) hypertension: Secondary | ICD-10-CM | POA: Diagnosis not present

## 2023-09-14 DIAGNOSIS — K219 Gastro-esophageal reflux disease without esophagitis: Secondary | ICD-10-CM | POA: Diagnosis not present

## 2023-09-14 DIAGNOSIS — M069 Rheumatoid arthritis, unspecified: Secondary | ICD-10-CM | POA: Diagnosis present

## 2023-09-14 DIAGNOSIS — T39395A Adverse effect of other nonsteroidal anti-inflammatory drugs [NSAID], initial encounter: Secondary | ICD-10-CM | POA: Diagnosis not present

## 2023-09-14 DIAGNOSIS — Z8349 Family history of other endocrine, nutritional and metabolic diseases: Secondary | ICD-10-CM

## 2023-09-14 DIAGNOSIS — B9620 Unspecified Escherichia coli [E. coli] as the cause of diseases classified elsewhere: Secondary | ICD-10-CM | POA: Diagnosis present

## 2023-09-14 DIAGNOSIS — N179 Acute kidney failure, unspecified: Secondary | ICD-10-CM | POA: Diagnosis not present

## 2023-09-14 DIAGNOSIS — E66812 Obesity, class 2: Secondary | ICD-10-CM | POA: Diagnosis present

## 2023-09-14 DIAGNOSIS — N170 Acute kidney failure with tubular necrosis: Secondary | ICD-10-CM | POA: Diagnosis not present

## 2023-09-14 DIAGNOSIS — Z833 Family history of diabetes mellitus: Secondary | ICD-10-CM | POA: Diagnosis not present

## 2023-09-14 DIAGNOSIS — Z9049 Acquired absence of other specified parts of digestive tract: Secondary | ICD-10-CM

## 2023-09-14 DIAGNOSIS — Z79899 Other long term (current) drug therapy: Secondary | ICD-10-CM | POA: Diagnosis not present

## 2023-09-14 DIAGNOSIS — R1032 Left lower quadrant pain: Secondary | ICD-10-CM | POA: Diagnosis not present

## 2023-09-14 DIAGNOSIS — R7881 Bacteremia: Secondary | ICD-10-CM | POA: Diagnosis not present

## 2023-09-14 DIAGNOSIS — E860 Dehydration: Secondary | ICD-10-CM | POA: Diagnosis not present

## 2023-09-14 DIAGNOSIS — K5900 Constipation, unspecified: Secondary | ICD-10-CM | POA: Diagnosis not present

## 2023-09-14 DIAGNOSIS — F32A Depression, unspecified: Secondary | ICD-10-CM | POA: Diagnosis not present

## 2023-09-14 DIAGNOSIS — F419 Anxiety disorder, unspecified: Secondary | ICD-10-CM | POA: Diagnosis not present

## 2023-09-14 DIAGNOSIS — Z7984 Long term (current) use of oral hypoglycemic drugs: Secondary | ICD-10-CM

## 2023-09-14 DIAGNOSIS — T502X5A Adverse effect of carbonic-anhydrase inhibitors, benzothiadiazides and other diuretics, initial encounter: Secondary | ICD-10-CM | POA: Diagnosis present

## 2023-09-14 DIAGNOSIS — T383X5A Adverse effect of insulin and oral hypoglycemic [antidiabetic] drugs, initial encounter: Secondary | ICD-10-CM | POA: Diagnosis present

## 2023-09-14 DIAGNOSIS — E119 Type 2 diabetes mellitus without complications: Secondary | ICD-10-CM | POA: Diagnosis not present

## 2023-09-14 LAB — CBC WITH DIFFERENTIAL/PLATELET
Abs Immature Granulocytes: 0.11 10*3/uL — ABNORMAL HIGH (ref 0.00–0.07)
Basophils Absolute: 0 10*3/uL (ref 0.0–0.1)
Basophils Relative: 0 %
Eosinophils Absolute: 0.1 10*3/uL (ref 0.0–0.5)
Eosinophils Relative: 0 %
HCT: 36.9 % (ref 36.0–46.0)
Hemoglobin: 13 g/dL (ref 12.0–15.0)
Immature Granulocytes: 1 %
Lymphocytes Relative: 7 %
Lymphs Abs: 0.9 10*3/uL (ref 0.7–4.0)
MCH: 32.1 pg (ref 26.0–34.0)
MCHC: 35.2 g/dL (ref 30.0–36.0)
MCV: 91.1 fL (ref 80.0–100.0)
Monocytes Absolute: 1.2 10*3/uL — ABNORMAL HIGH (ref 0.1–1.0)
Monocytes Relative: 10 %
Neutro Abs: 10 10*3/uL — ABNORMAL HIGH (ref 1.7–7.7)
Neutrophils Relative %: 82 %
Platelets: 339 10*3/uL (ref 150–400)
RBC: 4.05 MIL/uL (ref 3.87–5.11)
RDW: 12.6 % (ref 11.5–15.5)
WBC: 12.2 10*3/uL — ABNORMAL HIGH (ref 4.0–10.5)
nRBC: 0 % (ref 0.0–0.2)

## 2023-09-14 LAB — COMPREHENSIVE METABOLIC PANEL WITH GFR
ALT: 60 U/L — ABNORMAL HIGH (ref 0–44)
AST: 53 U/L — ABNORMAL HIGH (ref 15–41)
Albumin: 3.9 g/dL (ref 3.5–5.0)
Alkaline Phosphatase: 168 U/L — ABNORMAL HIGH (ref 38–126)
Anion gap: 19 — ABNORMAL HIGH (ref 5–15)
BUN: 50 mg/dL — ABNORMAL HIGH (ref 8–23)
CO2: 18 mmol/L — ABNORMAL LOW (ref 22–32)
Calcium: 9.9 mg/dL (ref 8.9–10.3)
Chloride: 97 mmol/L — ABNORMAL LOW (ref 98–111)
Creatinine, Ser: 4.5 mg/dL — ABNORMAL HIGH (ref 0.44–1.00)
GFR, Estimated: 10 mL/min — ABNORMAL LOW (ref >60)
Glucose, Bld: 113 mg/dL — ABNORMAL HIGH (ref 70–99)
Potassium: 3.6 mmol/L (ref 3.5–5.1)
Sodium: 134 mmol/L — ABNORMAL LOW (ref 135–145)
Total Bilirubin: 0.7 mg/dL (ref 0.0–1.2)
Total Protein: 7.8 g/dL (ref 6.5–8.1)

## 2023-09-14 LAB — URINALYSIS, ROUTINE W REFLEX MICROSCOPIC
Glucose, UA: NEGATIVE mg/dL
Ketones, ur: NEGATIVE mg/dL
Leukocytes,Ua: NEGATIVE
Nitrite: NEGATIVE
Protein, ur: 100 mg/dL — AB
Specific Gravity, Urine: 1.02 (ref 1.005–1.030)
pH: 5.5 (ref 5.0–8.0)

## 2023-09-14 LAB — URINALYSIS, MICROSCOPIC (REFLEX)

## 2023-09-14 LAB — CREATININE, SERUM
Creatinine, Ser: 4.77 mg/dL — ABNORMAL HIGH (ref 0.44–1.00)
GFR, Estimated: 10 mL/min — ABNORMAL LOW (ref >60)

## 2023-09-14 LAB — PHOSPHORUS: Phosphorus: 5 mg/dL — ABNORMAL HIGH (ref 2.5–4.6)

## 2023-09-14 LAB — MAGNESIUM: Magnesium: 2.4 mg/dL (ref 1.7–2.4)

## 2023-09-14 LAB — LACTIC ACID, PLASMA: Lactic Acid, Venous: 0.6 mmol/L (ref 0.5–1.9)

## 2023-09-14 LAB — LIPASE, BLOOD: Lipase: 36 U/L (ref 11–51)

## 2023-09-14 LAB — HIV ANTIBODY (ROUTINE TESTING W REFLEX): HIV Screen 4th Generation wRfx: NONREACTIVE

## 2023-09-14 LAB — CK: Total CK: 127 U/L (ref 38–234)

## 2023-09-14 MED ORDER — ADULT MULTIVITAMIN W/MINERALS CH
1.0000 | ORAL_TABLET | Freq: Every day | ORAL | Status: DC
  Administered 2023-09-15 – 2023-09-17 (×3): 1 via ORAL
  Filled 2023-09-14 (×3): qty 1

## 2023-09-14 MED ORDER — ENOXAPARIN SODIUM 30 MG/0.3ML IJ SOSY
30.0000 mg | PREFILLED_SYRINGE | INTRAMUSCULAR | Status: DC
  Filled 2023-09-14: qty 0.3

## 2023-09-14 MED ORDER — AMLODIPINE BESYLATE 10 MG PO TABS
10.0000 mg | ORAL_TABLET | Freq: Every day | ORAL | Status: DC
  Administered 2023-09-14 – 2023-09-16 (×3): 10 mg via ORAL
  Filled 2023-09-14 (×3): qty 1

## 2023-09-14 MED ORDER — OMEGA-3-ACID ETHYL ESTERS 1 G PO CAPS: 1.0000 g | ORAL_CAPSULE | Freq: Every day | ORAL | Status: DC

## 2023-09-14 MED ORDER — ONDANSETRON HCL 4 MG PO TABS: 4.0000 mg | ORAL_TABLET | Freq: Four times a day (QID) | ORAL | Status: DC | PRN

## 2023-09-14 MED ORDER — ONDANSETRON HCL 4 MG/2ML IJ SOLN: 4.0000 mg | Freq: Four times a day (QID) | INTRAMUSCULAR | Status: DC | PRN

## 2023-09-14 MED ORDER — ROSUVASTATIN CALCIUM 10 MG PO TABS
40.0000 mg | ORAL_TABLET | Freq: Every day | ORAL | Status: DC
  Administered 2023-09-14 – 2023-09-17 (×4): 40 mg via ORAL
  Filled 2023-09-14 (×4): qty 4

## 2023-09-14 MED ORDER — PANTOPRAZOLE SODIUM 40 MG PO TBEC
40.0000 mg | DELAYED_RELEASE_TABLET | Freq: Every day | ORAL | Status: DC
  Administered 2023-09-15 – 2023-09-17 (×3): 40 mg via ORAL
  Filled 2023-09-14 (×3): qty 1

## 2023-09-14 MED ORDER — SODIUM CHLORIDE 0.9 % IV BOLUS
1000.0000 mL | Freq: Once | INTRAVENOUS | Status: AC
Start: 2023-09-14 — End: 2023-09-14
  Administered 2023-09-14: 1000 mL via INTRAVENOUS

## 2023-09-14 MED ORDER — OMEGA-3-ACID ETHYL ESTERS 1 G PO CAPS
1.0000 g | ORAL_CAPSULE | Freq: Every day | ORAL | Status: DC
  Administered 2023-09-15 – 2023-09-17 (×3): 1 g via ORAL
  Filled 2023-09-14 (×3): qty 1

## 2023-09-14 MED ORDER — VITAMIN D3 25 MCG (1000 UNIT) PO TABS
2000.0000 [IU] | ORAL_TABLET | Freq: Every day | ORAL | Status: DC
  Administered 2023-09-15 – 2023-09-17 (×3): 2000 [IU] via ORAL
  Filled 2023-09-14 (×3): qty 2

## 2023-09-14 MED ORDER — ASPIRIN 81 MG PO TBEC
81.0000 mg | DELAYED_RELEASE_TABLET | Freq: Every day | ORAL | Status: DC
  Administered 2023-09-15 – 2023-09-17 (×3): 81 mg via ORAL
  Filled 2023-09-14 (×3): qty 1

## 2023-09-14 MED ORDER — POLYETHYLENE GLYCOL 3350 17 G PO PACK
17.0000 g | PACK | Freq: Every day | ORAL | Status: DC
  Administered 2023-09-15 – 2023-09-17 (×3): 17 g via ORAL
  Filled 2023-09-14 (×3): qty 1

## 2023-09-14 MED ORDER — CENTRUM SILVER 50+WOMEN PO TABS: ORAL_TABLET | Freq: Every day | ORAL | Status: DC

## 2023-09-14 MED ORDER — ALPRAZOLAM 0.5 MG PO TABS
0.5000 mg | ORAL_TABLET | Freq: Every day | ORAL | Status: DC | PRN
  Administered 2023-09-15 – 2023-09-16 (×2): 0.5 mg via ORAL
  Filled 2023-09-14 (×2): qty 1

## 2023-09-14 MED ORDER — BUPROPION HCL ER (SR) 150 MG PO TB12
150.0000 mg | ORAL_TABLET | Freq: Every morning | ORAL | Status: DC
  Administered 2023-09-15 – 2023-09-17 (×3): 150 mg via ORAL
  Filled 2023-09-14 (×3): qty 1

## 2023-09-14 MED ORDER — ACETAMINOPHEN 325 MG PO TABS
650.0000 mg | ORAL_TABLET | Freq: Four times a day (QID) | ORAL | Status: DC | PRN
  Administered 2023-09-14 – 2023-09-16 (×5): 650 mg via ORAL
  Filled 2023-09-14 (×5): qty 2

## 2023-09-14 MED ORDER — SENNOSIDES-DOCUSATE SODIUM 8.6-50 MG PO TABS
2.0000 | ORAL_TABLET | Freq: Every day | ORAL | Status: DC
  Administered 2023-09-14 – 2023-09-16 (×3): 2 via ORAL
  Filled 2023-09-14 (×3): qty 2

## 2023-09-14 MED ORDER — ACETAMINOPHEN 650 MG RE SUPP: 650.0000 mg | Freq: Four times a day (QID) | RECTAL | Status: DC | PRN

## 2023-09-14 MED ORDER — SENNOSIDES-DOCUSATE SODIUM 8.6-50 MG PO TABS: 1.0000 | ORAL_TABLET | Freq: Every day | ORAL | Status: DC

## 2023-09-14 MED ORDER — SODIUM CHLORIDE 0.9 % IV SOLN
INTRAVENOUS | Status: AC
Start: 2023-09-14 — End: 2023-09-15

## 2023-09-14 MED ORDER — SODIUM CHLORIDE 0.9 % IV BOLUS
1000.0000 mL | Freq: Once | INTRAVENOUS | Status: AC
  Administered 2023-09-14: 1000 mL via INTRAVENOUS

## 2023-09-14 NOTE — ED Notes (Signed)
 Called [facility] for transpor to [facility] @15 :53.  Spoke with Mariah Shines

## 2023-09-14 NOTE — Discharge Instructions (Signed)
 [facility] Emergency Room [address]  [place], [place]

## 2023-09-14 NOTE — ED Provider Notes (Signed)
 Patient with history of diverticulosis presents to urgent care for evaluation of left lower quadrant abdominal pain, fever, chills, nausea, vomiting, and poor oral intake for the last 5 days. Max temp at home 103 a few days ago, she has not had a fever in the last 24 hours. States abdomen feels distended and bloated and she has had minimal oral intake in the last 5 days due to abdominal pain. She has not had a bowel movement in the last 5 days but is still able to pass a a small amount of gas.  Denies diarrhea, blood/mucus with stools, headaches, and dizziness.  History of diverticulosis and diverticulitis, states she has had a diverticulitis flare in the past but it has never lasted this long and been this severe.  Point tender with guarding on palpation of the left lower quadrant abdomen/left suprapubic region.  Abdomen is distended.  Bowel sounds present to all 4 quadrants.   High clinical suspicion for acute diverticulitis flare, however I would like for her to be evaluated further in the emergency room with stat blood work and imaging that we are unable to provide here in the urgent care setting to rule out intra-abdominal emergency due to complication from likely diverticulitis.  Discussed clinical concerns/exam findings leading to recommendation for further workup in the ER setting and risks of deferring ER visit with patient/family. Patient/family express understanding and agreement with plan, discharged to ER via private car.    Shella Devoid [place], [place] 09/14/23 [phone]

## 2023-09-14 NOTE — ED Provider Notes (Signed)
 [place] [facility] AT [facility] Provider Note   CSN: [id number] Arrival date & time: 09/14/23  4098     History  Chief Complaint  Patient presents with   Abdominal Pain    Sandra Henderson is a 63 y.o. female.  Patient with history of cholecystectomy, diverticulosis, history of diabetes, hypertension, high cholesterol -- presents to the emergency department today for evaluation of left lower quadrant abdominal pain.  Her symptoms started about 5 days ago.  Initially patient thought that she may have had a UTI and actually took a couple doses of nitrofurantoin that she had at home.  She was fatigued and was spiking fevers, as high as 103 F overnight.  Over the past 2 days, she has had less in the way of fever.  She had some vomiting and dry heaves.  She had left lower quadrant pain several days ago that has now become more generalized.  No chest pain or shortness of breath.  No dysuria, hematuria, increased frequency or urgency.  Patient denies any recent antibiotic use, travel, suspicious food or water exposures, camping.       Home Medications Prior to Admission medications   Medication Sig Start Date End Date Taking? Authorizing Provider  acetaminophen  (TYLENOL ) 325 MG tablet Take 325 mg by mouth every 6 (six) hours as needed for moderate pain or headache.    [provider]  ALPRAZolam  (XANAX ) 1 MG tablet Take 0.5 mg by mouth daily as needed for anxiety.     [provider]  amLODipine  (NORVASC ) 10 MG tablet Take 10 mg by mouth at bedtime. 02/22/21   [provider]  aspirin 325 MG EC tablet Take 325 mg by mouth daily.    [provider]  AZO-CRANBERRY PO Take 2 tablets by mouth daily.    [provider]  buPROPion (WELLBUTRIN SR) 150 MG 12 hr tablet Take 150 mg by mouth every morning. 04/30/21   [provider]  Cholecalciferol (QC VITAMIN D3) 50 MCG (2000 UT) TABS Take 2,000 Units by mouth daily.     [provider]  hydrochlorothiazide  (HYDRODIURIL ) 12.5 MG tablet Take 1 tablet (12.5 mg total) by mouth daily. 12/12/19   Jasmine Mesi D, MD  ibuprofen (ADVIL,MOTRIN) 200 MG tablet Take 400 mg by mouth every 6 (six) hours as needed (Pain).    [provider]  metFORMIN  (GLUCOPHAGE -XR) 500 MG 24 hr tablet Take 500 mg by mouth daily. 04/11/21   [provider]  Multiple Vitamins-Minerals (CENTRUM SILVER 50+WOMEN PO) Take 1 tablet by mouth daily.    [provider]  Nystatin (NYAMYC) 100000 UNIT/GM POWD Apply 1 application topically daily as needed (Rash).  04/30/15   [provider]  olmesartan  (BENICAR ) 40 MG tablet Take 40 mg by mouth at bedtime. 02/22/21   [provider]  Omega-3 Fatty Acids (FISH OIL) 1200 MG CAPS Take 1 capsule by mouth daily.    [provider]  omeprazole (PRILOSEC) 20 MG capsule Take 20 mg by mouth daily.    [provider]  POTASSIUM GLUCONATE PO Take 1 tablet by mouth daily.    [provider]  simvastatin  (ZOCOR ) 20 MG tablet Take 20 mg by mouth at bedtime.    [provider]      Allergies    Patient has no known allergies.    Review of Systems   Review of Systems  Physical Exam Updated Vital Signs BP 129/75 (BP Location: Right Arm)  Pulse 98   Temp 98.8 F (37.1 C) (Oral)   Resp 14   Ht 5\' 3"  (1.6 m)   Wt 99.3 kg   LMP 06/12/2018 (Exact Date)   SpO2 98%   BMI 38.78 kg/m   Physical Exam Vitals and nursing note reviewed.  Constitutional:      General: She is not in acute distress.    Appearance: She is well-developed.  HENT:     Head: Normocephalic and atraumatic.     Right Ear: External ear normal.     Left Ear: External ear normal.     Nose: Nose normal.  Eyes:     Conjunctiva/sclera: Conjunctivae normal.  Cardiovascular:     Rate and Rhythm: Normal rate and regular rhythm.     Heart sounds: No murmur heard. Pulmonary:     Effort: No respiratory  distress.     Breath sounds: No wheezing, rhonchi or rales.  Abdominal:     Palpations: Abdomen is soft.     Tenderness: There is generalized abdominal tenderness (mild) and tenderness in the left lower quadrant. There is no guarding or rebound. Negative signs include Murphy's sign and McBurney's sign.  Musculoskeletal:     Cervical back: Normal range of motion and neck supple.     Right lower leg: No edema.     Left lower leg: No edema.  Skin:    General: Skin is warm and dry.     Findings: No rash.  Neurological:     General: No focal deficit present.     Mental Status: She is alert. Mental status is at baseline.     Motor: No weakness.  Psychiatric:        Mood and Affect: Mood normal.     ED Results / Procedures / Treatments   Labs (all labs ordered are listed, but only abnormal results are displayed) Labs Reviewed  CBC WITH DIFFERENTIAL/PLATELET - Abnormal; Notable for the following components:      Result Value   WBC 12.2 (*)    Neutro Abs 10.0 (*)    Monocytes Absolute 1.2 (*)    Abs Immature Granulocytes 0.11 (*)    All other components within normal limits  COMPREHENSIVE METABOLIC PANEL WITH GFR - Abnormal; Notable for the following components:   Sodium 134 (*)    Chloride 97 (*)    CO2 18 (*)    Glucose, Bld 113 (*)    BUN 50 (*)    Creatinine, Ser 4.50 (*)    AST 53 (*)    ALT 60 (*)    Alkaline Phosphatase 168 (*)    GFR, Estimated 10 (*)    Anion gap 19 (*)    All other components within normal limits  URINALYSIS, ROUTINE W REFLEX MICROSCOPIC - Abnormal; Notable for the following components:   APPearance HAZY (*)    Hgb urine dipstick SMALL (*)    Bilirubin Urine SMALL (*)    Protein, ur 100 (*)    All other components within normal limits  URINALYSIS, MICROSCOPIC (REFLEX) - Abnormal; Notable for the following components:   Bacteria, UA RARE (*)    All other components within normal limits  CREATININE, SERUM - Abnormal; Notable for the following  components:   Creatinine, Ser 4.77 (*)    GFR, Estimated 10 (*)    All other components within normal limits  CULTURE, BLOOD (ROUTINE X 2)  CULTURE, BLOOD (ROUTINE X 2)  LIPASE, BLOOD  HEPATITIS PANEL, ACUTE  LACTIC ACID,  PLASMA    EKG None  Radiology CT ABDOMEN PELVIS WO CONTRAST Result Date: 09/14/2023 CLINICAL DATA:  LLQ abdominal pain. EXAM: CT ABDOMEN AND PELVIS WITHOUT CONTRAST TECHNIQUE: Multidetector CT imaging of the abdomen and pelvis was performed following the standard protocol without IV contrast. RADIATION DOSE REDUCTION: This exam was performed according to the departmental dose-optimization program which includes automated exposure control, adjustment of the mA and/or kV according to patient size and/or use of iterative reconstruction technique. COMPARISON:  None Available. FINDINGS: Lower chest: There are patchy atelectatic changes at the left lung base. Bilateral lungs are otherwise clear. No overt consolidation. No pleural effusion. The heart is normal in size. No pericardial effusion. Hepatobiliary: The liver is normal in size. Non-cirrhotic configuration. No suspicious mass. There is a subcapsular 7 x 10 mm focus in the right hepatic lobe, segment 5, which is too small to adequately characterize. No intrahepatic or extrahepatic bile duct dilation. Gallbladder is surgically absent. Pancreas: Unremarkable. No pancreatic ductal dilatation or surrounding inflammatory changes. Spleen: Within normal limits. No focal lesion. Adrenals/Urinary Tract: Adrenal glands are unremarkable. No suspicious renal mass within the limitations of this unenhanced exam. There is a 4 mm nonobstructing calculus in the left kidney upper pole and a 3 mm nonobstructing calculus in the right kidney lower pole. No ureterolithiasis or obstructive uropathy on either side. Urinary bladder is under distended, precluding optimal assessment. However, no large mass or stones identified. No perivesical fat stranding.  Stomach/Bowel: No disproportionate dilation of the small or large bowel loops. No evidence of abnormal bowel wall thickening or inflammatory changes. The appendix is unremarkable. There are multiple diverticula throughout the colon, without imaging signs of diverticulitis. Vascular/Lymphatic: No ascites or pneumoperitoneum. No abdominal or pelvic lymphadenopathy, by size criteria. No aneurysmal dilation of the major abdominal arteries. There are mild peripheral atherosclerotic vascular calcifications of the aorta and its major branches. Reproductive: The uterus is unremarkable. No large adnexal mass. Other: There is a tiny fat containing umbilical hernia. There is a small right paramedian fat containing epigastric ventral hernia with narrow neck. No herniation of bowel loops. The soft tissues and abdominal wall are otherwise unremarkable. Musculoskeletal: No suspicious osseous lesions. There are mild multilevel degenerative changes in the visualized spine. IMPRESSION: 1. No acute inflammatory process identified within the abdomen or pelvis. 2. There are bilateral nonobstructing renal calculi, as described above. No ureterolithiasis or obstructive uropathy on either side. 3. Multiple other nonacute observations, as described above. Aortic Atherosclerosis (ICD10-I70.0). Electronically Signed   By: Beula Brunswick M.D.   On: 09/14/2023 13:42    Procedures Procedures    Medications Ordered in ED Medications  sodium chloride  0.9 % bolus 1,000 mL (1,000 mLs Intravenous New Bag/Given 09/14/23 1341)    ED Course/ Medical Decision Making/ A&P    Patient seen and examined. History obtained directly from patient.   Labs/EKG: Ordered CBC, CMP, lipase, UA  Imaging: Ordered CT abdomen pelvis.  Medications/Fluids: Offered pain medication and nausea medication, patient declines  Most recent vital signs reviewed and are as follows: BP 129/75 (BP Location: Right Arm)   Pulse 98   Temp 98.8 F (37.1 C) (Oral)    Resp 14   Ht 5\' 3"  (1.6 m)   Wt 99.3 kg   LMP 06/12/2018 (Exact Date)   SpO2 98%   BMI 38.78 kg/m   Initial impression: Left lower quadrant abdominal pain, high clinical concern for diverticulitis, will need to evaluate for possibility of abscess or perforation given duration of symptoms.  Will also consider other potential etiologies of abdominal pain including colitis, renal colic, renal abscess, enteritis, peptic ulcer disease.  2:49 PM Reassessment performed. Patient appears stable during several rechecks.  Pain controlled.  There was a delay in obtaining CT imaging due to [facility] analyzer being down.  This led to a delay in obtaining creatinine.  This came back elevated.  Labs personally reviewed and interpreted including: CBC with elevated white blood cell count at 12.2 with predominant neutrophils; CMP sodium slightly low at 134, creatinine low at 97, glucose 113, creatinine markedly elevated from baseline at 4.5 with a BUN of 50, mildly elevated transaminases and alkaline phosphatase, elevated anion gap, possibly related to uremia; creatinine sent for verification, returned with creatinine of 4.77; UA with small bilirubin otherwise no compelling signs of infection.  Imaging personally visualized and interpreted including: CT without evidence of diverticulitis or other obvious infectious cause.  Reviewed pertinent lab work and imaging with patient at bedside. Questions answered.   Most current vital signs reviewed and are as follows: BP 112/72   Pulse 87   Temp 98.6 F (37 C) (Oral)   Resp 16   Ht 5\' 3"  (1.6 m)   Wt 99.3 kg   LMP 06/12/2018 (Exact Date)   SpO2 97%   BMI 38.78 kg/m   Plan: Patient has a significant acute kidney injury and will require admission to the hospital.  Given recent fevers, will send lactate and blood cultures, hepatitis panel.  Will discuss with hospitalist for admission.  Husband at bedside also aware of results.  3:02 PM Discussed with [facility] who  accepts for admission.                                  Medical Decision Making Amount and/or Complexity of Data Reviewed Labs: ordered. Radiology: ordered.  Risk Decision regarding hospitalization.   Patient presents with left lower quadrant pain, now more generalized, recent fevers although currently afebrile.  Patient has a significant acute kidney injury of unclear etiology.  She does not appear to be particularly hypovolemic.  She will need to be admitted for stabilization and to further workup AKI.        Final Clinical Impression(s) / ED Diagnoses Final diagnoses:  Acute kidney injury (HCC)  Left lower quadrant abdominal pain    Rx / DC Orders ED Discharge Orders     None         Lyna Sandhoff, PA-C 09/14/23 1503    Albertus Hughs, DO 09/14/23 1516

## 2023-09-14 NOTE — ED Notes (Signed)
 Patient is being discharged from the Urgent Care and sent to the Emergency Department via POV . Per Michlle NP, patient is in need of higher level of care due to abdominal pain. Patient is aware and verbalizes understanding of plan of care.  Vitals:   09/14/23 0823  BP: 122/82  Pulse: 72  Resp: 18  Temp: 98.4 F (36.9 C)  SpO2: 95%

## 2023-09-14 NOTE — ED Triage Notes (Signed)
 Pt c/o abdominal pain in the left and right lower quad but states its mainly on the left. Pt states she has been running fevers of almost 103. Pt states her last BM was last Wednesday. Pt states that she usually has 1-2 BM per day. Pt c/o vomiting Wed-Friday last week. Pt states she is now keeping food down.

## 2023-09-14 NOTE — Progress Notes (Signed)
 Plan of Care Note for accepted transfer   Patient: MANE WEITMAN MRN: [id number]   DOA: 09/14/2023  Facility requesting transfer: [facility] Requesting Provider:  Albertus Hughs, DO; Lyna Sandhoff, PA-C Reason for transfer: Acute Kidney Injury Facility course: Patient with history of HTN, HLD, IBS, RA, diverticulosis, cholecystectomy and depression/anxiety who initially presented to the urgent care for evaluation of LLQ pain, fevers, chills, nausea and vomiting and poor oral intake over the last 5 days. She was instructed to present to the ED for further evaluation for possible diverticulitis.  Initial vitals showed temp 98.4, RR 18, BP 122/82, SBP 95% on room air  Labs significant for WBC 12.2, Hgb 13.0, platelet 339, sodium 134, K+ 3.6, bicarb 18, glucose 113, BUN/creatinine 50/4.50 (12/0.73 on 05/16/2021), lipase 36, UA with mild hemoglobinuria, mild bilirubinuria, moderate proteinuria but no signs of infection.  CT abdomen pelvis does not show any acute inflammatory process in the abdomen of the pelvis that shows bilateral nonobstructive renal calculi, no ureterolithiasis or obstructive uropathy.  Patient received IV LR 1 L bolus.  Patient admitted to [facility] service at [facility] of care: The patient is accepted for admission to Med-surg  unit, at [facility]..  Management of AKI versus undiagnosed CKD  Author: Vita Grip, MD 09/14/2023  Check [email or web address] for on-call coverage.  Nursing staff, Please call [facility] Admits & Consults System-Wide number on Amion as soon as patient's arrival, so appropriate admitting provider can evaluate the pt.

## 2023-09-14 NOTE — ED Triage Notes (Signed)
 Left lower abd pain x 5 days has had a temp  ( 103  at one time )  some nausea  chills  last BM last wed

## 2023-09-14 NOTE — H&P (Signed)
 History and Physical  Sandra Henderson:[id number] DOB: 10-20-1960 DOA: 09/14/2023  PCP: Karalee Oscar, PA   Chief Complaint: Abdominal pain  HPI: Sandra Henderson is a 63 y.o. female with medical history significant for  HTN, HLD, IBS, RA, diverticulosis, cholecystectomy and depression/anxiety who initially presented to the urgent care for evaluation of LLQ pain, fevers, chills, nausea and vomiting and poor oral intake over the last 5 days.  Patient reports she has not had a bowel movement for the last 5 days with her normal being 2 BMs a day.  She has had progressive LLQ abdominal pain that has now become generalized.  She also reports having daily fevers as high as 103 that resolved about 2 days ago.  She also reports associated nausea, vomiting, bloating and chills but denies any dysuria, shortness of breath, chest pain, headache or bloody stools.  She does report feeling slightly dehydrated.  She initially thought she was coming down with a UTI so she took nitrofurantoin on Friday and Saturday.  Her abdominal pain has now improved however she continues to feel bloated and distended.  [facility] ED Course: Initial vitals showed temp 98.4, RR 18, BP 122/82, SBP 95% on room air. Labs significant for WBC 12.2, Hgb 13.0, platelet 339, sodium 134, K+ 3.6, bicarb 18, glucose 113, BUN/creatinine 50/4.50 (12/0.73 on 05/16/2021), lipase 36, UA with mild hemoglobinuria, mild bilirubinuria, moderate proteinuria but no signs of infection.   CT abdomen pelvis does not show any acute inflammatory process in the abdomen of the pelvis that shows bilateral nonobstructive renal calculi, no ureterolithiasis or obstructive uropathy.  Patient received IV LR 1 L bolus. Patient admitted to [facility] service and transferred to [facility]  Review of Systems: Please see HPI for pertinent positives and negatives. A complete 10 system review of systems are otherwise negative.  Past Medical History:  Diagnosis Date   Anxiety     Back pain    Chest pain    Depression    Edema, lower extremity    Family history of adverse reaction to anesthesia    "Mom's blood pressure bottoms out"    Gallbladder disease    GERD (gastroesophageal reflux disease)    Hemorrhoid    Hyperlipidemia    Hypertension    IBS (irritable bowel syndrome)    Joint pain    Kidney stones    Obesity    Rheumatoid arthritis (HCC)    Shortness of breath    SUI (stress urinary incontinence, female)    Vitamin D  deficiency    Past Surgical History:  Procedure Laterality Date   CHOLECYSTECTOMY OPEN  ~ 1988   COLONOSCOPY WITH PROPOFOL  N/A 06/19/2015   Procedure: COLONOSCOPY WITH PROPOFOL ;  Surgeon: Garrett Kallman, MD;  Location: [facility] ENDOSCOPY;  Service: Endoscopy;  Laterality: N/A;   ESOPHAGOGASTRODUODENOSCOPY (EGD) WITH PROPOFOL  N/A 06/19/2015   Procedure: ESOPHAGOGASTRODUODENOSCOPY (EGD) WITH PROPOFOL ;  Surgeon: Garrett Kallman, MD;  Location: [facility] ENDOSCOPY;  Service: Endoscopy;  Laterality: N/A;   TONSILLECTOMY  1960's   Social History:  reports that she has never smoked. She has never used smokeless tobacco. She reports current alcohol  use of about 22.0 standard drinks of alcohol  per week. She reports that she does not use drugs.  No Known Allergies  Family History  Problem Relation Age of Onset   Cancer Mother    Hypertension Mother    Hyperlipidemia Mother    Diabetes Mother    Sleep apnea Mother    Thyroid  disease Mother  Obesity Mother    Cancer Father        STOMACH   Cancer Daughter      Prior to Admission medications   Medication Sig Start Date End Date Taking? Authorizing Provider  acetaminophen  (TYLENOL ) 325 MG tablet Take 325 mg by mouth every 6 (six) hours as needed for moderate pain or headache.    [provider]  ALPRAZolam  (XANAX ) 1 MG tablet Take 0.5 mg by mouth daily as needed for anxiety.     [provider]  amLODipine  (NORVASC ) 10 MG tablet Take 10 mg by mouth at bedtime. 02/22/21    [provider]  aspirin 325 MG EC tablet Take 325 mg by mouth daily.    [provider]  AZO-CRANBERRY PO Take 2 tablets by mouth daily.    [provider]  buPROPion (WELLBUTRIN SR) 150 MG 12 hr tablet Take 150 mg by mouth every morning. 04/30/21   [provider]  Cholecalciferol (QC VITAMIN D3) 50 MCG (2000 UT) TABS Take 2,000 Units by mouth daily.    [provider]  hydrochlorothiazide  (HYDRODIURIL ) 12.5 MG tablet Take 1 tablet (12.5 mg total) by mouth daily. 12/12/19   Jasmine Mesi D, MD  ibuprofen (ADVIL,MOTRIN) 200 MG tablet Take 400 mg by mouth every 6 (six) hours as needed (Pain).    [provider]  metFORMIN  (GLUCOPHAGE -XR) 500 MG 24 hr tablet Take 500 mg by mouth daily. 04/11/21   [provider]  Multiple Vitamins-Minerals (CENTRUM SILVER 50+WOMEN PO) Take 1 tablet by mouth daily.    [provider]  Nystatin (NYAMYC) 100000 UNIT/GM POWD Apply 1 application topically daily as needed (Rash).  04/30/15   [provider]  olmesartan  (BENICAR ) 40 MG tablet Take 40 mg by mouth at bedtime. 02/22/21   [provider]  Omega-3 Fatty Acids (FISH OIL) 1200 MG CAPS Take 1 capsule by mouth daily.    [provider]  omeprazole (PRILOSEC) 20 MG capsule Take 20 mg by mouth daily.    [provider]  POTASSIUM GLUCONATE PO Take 1 tablet by mouth daily.    [provider]  simvastatin  (ZOCOR ) 20 MG tablet Take 20 mg by mouth at bedtime.    [provider]    Physical Exam: BP (!) 146/75 (BP Location: Left Arm)   Pulse 100   Temp 98.5 F (36.9 C) (Oral)   Resp 16   Ht 5\' 3"  (1.6 m)   Wt 99.3 kg   LMP 06/12/2018 (Exact Date)   SpO2 97%   BMI 38.78 kg/m  General: Pleasant, well-appearing elderly woman sitting in bed. No acute distress. HEENT: [place]/AT. Anicteric sclera. Dry mucous membrane. CV: RRR. No murmurs, rubs, or gallops. No LE edema Pulmonary: Lungs CTAB.  Normal effort. No wheezing or rales. Abdominal: Soft, mild distention. Minimal tenderness to palpation. Normal bowel sounds. Extremities: Palpable radial and DP pulses. Normal ROM. Skin: Warm and dry. No obvious rash or lesions. Neuro: A&Ox3. Moves all extremities. Normal sensation to light touch. No focal deficit. Psych: Normal mood and affect          Labs on Admission:  Basic Metabolic Panel: Recent Labs  Lab 09/14/23 0944 09/14/23 1309 09/14/23 1510  NA 134*  --   --   K 3.6  --   --   CL 97*  --   --   CO2 18*  --   --   GLUCOSE 113*  --   --   BUN 50*  --   --  CREATININE 4.50* 4.77*  --   CALCIUM 9.9  --   --   MG  --   --  2.4  PHOS  --   --  5.0*   Liver Function Tests: Recent Labs  Lab 09/14/23 0944  AST 53*  ALT 60*  ALKPHOS 168*  BILITOT 0.7  PROT 7.8  ALBUMIN 3.9   Recent Labs  Lab 09/14/23 0944  LIPASE 36   No results for input(s): "AMMONIA" in the last 168 hours. CBC: Recent Labs  Lab 09/14/23 0944  WBC 12.2*  NEUTROABS 10.0*  HGB 13.0  HCT 36.9  MCV 91.1  PLT 339   Cardiac Enzymes: Recent Labs  Lab 09/14/23 1510  CKTOTAL 127   BNP (last 3 results) No results for input(s): "BNP" in the last 8760 hours.  ProBNP (last 3 results) No results for input(s): "PROBNP" in the last 8760 hours.  CBG: No results for input(s): "GLUCAP" in the last 168 hours.  Radiological Exams on Admission: CT ABDOMEN PELVIS WO CONTRAST Result Date: 09/14/2023 CLINICAL DATA:  LLQ abdominal pain. EXAM: CT ABDOMEN AND PELVIS WITHOUT CONTRAST TECHNIQUE: Multidetector CT imaging of the abdomen and pelvis was performed following the standard protocol without IV contrast. RADIATION DOSE REDUCTION: This exam was performed according to the departmental dose-optimization program which includes automated exposure control, adjustment of the mA and/or kV according to patient size and/or use of iterative reconstruction technique. COMPARISON:  None Available. FINDINGS:  Lower chest: There are patchy atelectatic changes at the left lung base. Bilateral lungs are otherwise clear. No overt consolidation. No pleural effusion. The heart is normal in size. No pericardial effusion. Hepatobiliary: The liver is normal in size. Non-cirrhotic configuration. No suspicious mass. There is a subcapsular 7 x 10 mm focus in the right hepatic lobe, segment 5, which is too small to adequately characterize. No intrahepatic or extrahepatic bile duct dilation. Gallbladder is surgically absent. Pancreas: Unremarkable. No pancreatic ductal dilatation or surrounding inflammatory changes. Spleen: Within normal limits. No focal lesion. Adrenals/Urinary Tract: Adrenal glands are unremarkable. No suspicious renal mass within the limitations of this unenhanced exam. There is a 4 mm nonobstructing calculus in the left kidney upper pole and a 3 mm nonobstructing calculus in the right kidney lower pole. No ureterolithiasis or obstructive uropathy on either side. Urinary bladder is under distended, precluding optimal assessment. However, no large mass or stones identified. No perivesical fat stranding. Stomach/Bowel: No disproportionate dilation of the small or large bowel loops. No evidence of abnormal bowel wall thickening or inflammatory changes. The appendix is unremarkable. There are multiple diverticula throughout the colon, without imaging signs of diverticulitis. Vascular/Lymphatic: No ascites or pneumoperitoneum. No abdominal or pelvic lymphadenopathy, by size criteria. No aneurysmal dilation of the major abdominal arteries. There are mild peripheral atherosclerotic vascular calcifications of the aorta and its major branches. Reproductive: The uterus is unremarkable. No large adnexal mass. Other: There is a tiny fat containing umbilical hernia. There is a small right paramedian fat containing epigastric ventral hernia with narrow neck. No herniation of bowel loops. The soft tissues and abdominal wall are  otherwise unremarkable. Musculoskeletal: No suspicious osseous lesions. There are mild multilevel degenerative changes in the visualized spine. IMPRESSION: 1. No acute inflammatory process identified within the abdomen or pelvis. 2. There are bilateral nonobstructing renal calculi, as described above. No ureterolithiasis or obstructive uropathy on either side. 3. Multiple other nonacute observations, as described above. Aortic Atherosclerosis (ICD10-I70.0). Electronically Signed   By: Belynda Brace.D.  On: 09/14/2023 13:42   Assessment/Plan Sandra Henderson is a 63 y.o. female with medical history significant for HTN, HLD, IBS, RA, diverticulosis, cholecystectomy and depression/anxiety who initially presented to the urgent care for evaluation of LLQ pain, fevers, chills, nausea and vomiting and poor oral intake over the last 5 days and admitted for acute kidney injury  # Acute kidney injury - Presented after 5 days of decreased p.o. intake in the setting of abd pain, N/V and constipation. - Significant elevation in BUN/creatinine to 50/4.50 from 14/0.73 on 06/09/2023 from PCP office - CT A/P shows small nonobstructive renal calculi but no hydronephrosis or ureterolithiasis - This is likely acute kidney injury in the setting of dehydration however cannot rule out CKD from her longstanding hypertension - Status post IV NS 1 L bolus x 2 in the ED - Continue IV hydration with start IV NS at 100 cc/h - Trend renal function and avoid nephrotoxic agents  # Constipation # Hx of diverticulosis - Patient with documented history of IBS presented with 5 days of constipation -CT A/P negative for diverticulitis or significant stool burden -Patient still feel bloated and mild abdominal discomfort but no significant pain -Start MiraLAX daily and Senokot-S at bedtime, escalate as needed for BM  # Elevated LFTs - CMP shows alk phos 168, AST/ALT 53/60 but normal bilirubin. - Pt w/ hx of cholecystectomy and CT  A/P with no acute pathology in the hepatobiliary tract - Likely in the setting of dehydration - Follow-up acute hepatitis panel - Trend LFTs  # HTN - BP stable with SBP in the 110s to 140s. - Continue amlodipine  - Hold olmesartan  and hydrochlorothiazide  in the setting of AKI  # HLD - Continue rosuvastatin and Lovaza  # Anxiety and depression - Continue bupropion and as needed Xanax   # GERD - PPI  DVT prophylaxis: Lovenox      Code Status: Full Code  Consults called: None  Family Communication: No family at bedside  Severity of Illness: The appropriate patient status for this patient is OBSERVATION. Observation status is judged to be reasonable and necessary in order to provide the required intensity of service to ensure the patient's safety. The patient's presenting symptoms, physical exam findings, and initial radiographic and laboratory data in the context of their medical condition is felt to place them at decreased risk for further clinical deterioration. Furthermore, it is anticipated that the patient will be medically stable for discharge from the hospital within 2 midnights of admission.   Level of care: Med-Surg   This record has been created using Conservation officer, historic buildings. Errors have been sought and corrected, but may not always be located. Such creation errors do not reflect on the standard of care.   Vita Grip, MD 09/14/2023, 7:29 PM [facility] Pager: [phone] Isaiah 41:10   If 7PM-7AM, please contact night-coverage [email or web address] Password [email or web address]

## 2023-09-15 DIAGNOSIS — Z83438 Family history of other disorder of lipoprotein metabolism and other lipidemia: Secondary | ICD-10-CM | POA: Diagnosis not present

## 2023-09-15 DIAGNOSIS — T39395A Adverse effect of other nonsteroidal anti-inflammatory drugs [NSAID], initial encounter: Secondary | ICD-10-CM | POA: Diagnosis present

## 2023-09-15 DIAGNOSIS — K581 Irritable bowel syndrome with constipation: Secondary | ICD-10-CM | POA: Diagnosis present

## 2023-09-15 DIAGNOSIS — K219 Gastro-esophageal reflux disease without esophagitis: Secondary | ICD-10-CM | POA: Diagnosis present

## 2023-09-15 DIAGNOSIS — R7881 Bacteremia: Secondary | ICD-10-CM | POA: Diagnosis present

## 2023-09-15 DIAGNOSIS — M069 Rheumatoid arthritis, unspecified: Secondary | ICD-10-CM | POA: Diagnosis present

## 2023-09-15 DIAGNOSIS — N179 Acute kidney failure, unspecified: Secondary | ICD-10-CM | POA: Diagnosis present

## 2023-09-15 DIAGNOSIS — Z8349 Family history of other endocrine, nutritional and metabolic diseases: Secondary | ICD-10-CM | POA: Diagnosis not present

## 2023-09-15 DIAGNOSIS — Z9049 Acquired absence of other specified parts of digestive tract: Secondary | ICD-10-CM | POA: Diagnosis not present

## 2023-09-15 DIAGNOSIS — F32A Depression, unspecified: Secondary | ICD-10-CM | POA: Diagnosis present

## 2023-09-15 DIAGNOSIS — E119 Type 2 diabetes mellitus without complications: Secondary | ICD-10-CM | POA: Diagnosis present

## 2023-09-15 DIAGNOSIS — Z8249 Family history of ischemic heart disease and other diseases of the circulatory system: Secondary | ICD-10-CM | POA: Diagnosis not present

## 2023-09-15 DIAGNOSIS — E66812 Obesity, class 2: Secondary | ICD-10-CM | POA: Diagnosis present

## 2023-09-15 DIAGNOSIS — Z6838 Body mass index (BMI) 38.0-38.9, adult: Secondary | ICD-10-CM | POA: Diagnosis not present

## 2023-09-15 DIAGNOSIS — Z79899 Other long term (current) drug therapy: Secondary | ICD-10-CM | POA: Diagnosis not present

## 2023-09-15 DIAGNOSIS — E860 Dehydration: Secondary | ICD-10-CM | POA: Diagnosis present

## 2023-09-15 DIAGNOSIS — F419 Anxiety disorder, unspecified: Secondary | ICD-10-CM | POA: Diagnosis present

## 2023-09-15 DIAGNOSIS — E7800 Pure hypercholesterolemia, unspecified: Secondary | ICD-10-CM | POA: Diagnosis present

## 2023-09-15 DIAGNOSIS — Z833 Family history of diabetes mellitus: Secondary | ICD-10-CM | POA: Diagnosis not present

## 2023-09-15 DIAGNOSIS — N170 Acute kidney failure with tubular necrosis: Secondary | ICD-10-CM | POA: Diagnosis present

## 2023-09-15 DIAGNOSIS — E872 Acidosis, unspecified: Secondary | ICD-10-CM | POA: Diagnosis present

## 2023-09-15 DIAGNOSIS — Z7984 Long term (current) use of oral hypoglycemic drugs: Secondary | ICD-10-CM | POA: Diagnosis not present

## 2023-09-15 DIAGNOSIS — B9620 Unspecified Escherichia coli [E. coli] as the cause of diseases classified elsewhere: Secondary | ICD-10-CM | POA: Diagnosis present

## 2023-09-15 DIAGNOSIS — I10 Essential (primary) hypertension: Secondary | ICD-10-CM | POA: Diagnosis present

## 2023-09-15 DIAGNOSIS — Z7982 Long term (current) use of aspirin: Secondary | ICD-10-CM | POA: Diagnosis not present

## 2023-09-15 LAB — CBC
HCT: 34.6 % — ABNORMAL LOW (ref 36.0–46.0)
Hemoglobin: 11.5 g/dL — ABNORMAL LOW (ref 12.0–15.0)
MCH: 31.4 pg (ref 26.0–34.0)
MCHC: 33.2 g/dL (ref 30.0–36.0)
MCV: 94.5 fL (ref 80.0–100.0)
Platelets: 328 10*3/uL (ref 150–400)
RBC: 3.66 MIL/uL — ABNORMAL LOW (ref 3.87–5.11)
RDW: 12.9 % (ref 11.5–15.5)
WBC: 9.9 10*3/uL (ref 4.0–10.5)
nRBC: 0 % (ref 0.0–0.2)

## 2023-09-15 LAB — BLOOD CULTURE ID PANEL (REFLEXED) - BCID2

## 2023-09-15 LAB — RENAL FUNCTION PANEL
Albumin: 2.8 g/dL — ABNORMAL LOW (ref 3.5–5.0)
Anion gap: 10 (ref 5–15)
BUN: 57 mg/dL — ABNORMAL HIGH (ref 8–23)
CO2: 21 mmol/L — ABNORMAL LOW (ref 22–32)
Calcium: 8.8 mg/dL — ABNORMAL LOW (ref 8.9–10.3)
Chloride: 107 mmol/L (ref 98–111)
Creatinine, Ser: 4.7 mg/dL — ABNORMAL HIGH (ref 0.44–1.00)
GFR, Estimated: 10 mL/min — ABNORMAL LOW (ref >60)
Glucose, Bld: 102 mg/dL — ABNORMAL HIGH (ref 70–99)
Phosphorus: 4.1 mg/dL (ref 2.5–4.6)
Potassium: 3.6 mmol/L (ref 3.5–5.1)
Sodium: 138 mmol/L (ref 135–145)

## 2023-09-15 MED ORDER — CARMEX CLASSIC LIP BALM EX OINT
TOPICAL_OINTMENT | CUTANEOUS | Status: DC | PRN
Start: 2023-09-15 — End: 2023-09-17
  Filled 2023-09-15: qty 10

## 2023-09-15 MED ORDER — TRAZODONE HCL 50 MG PO TABS: 50.0000 mg | ORAL_TABLET | Freq: Every evening | ORAL | Status: DC | PRN

## 2023-09-15 MED ORDER — HEPARIN SODIUM (PORCINE) 5000 UNIT/ML IJ SOLN
5000.0000 [IU] | Freq: Three times a day (TID) | INTRAMUSCULAR | Status: DC
  Filled 2023-09-15 (×2): qty 1

## 2023-09-15 MED ORDER — SODIUM CHLORIDE 0.9 % IV SOLN: INTRAVENOUS | Status: DC

## 2023-09-15 NOTE — Progress Notes (Signed)
   09/15/23 1106  TOC Brief Assessment  Insurance and Status Reviewed  Patient has primary care physician Yes  Home environment has been reviewed single family home  Prior level of function: independent  Prior/Current Home Services No current home services  Social Drivers of Health Review SDOH reviewed no interventions necessary  Readmission risk has been reviewed Yes  Transition of care needs no transition of care needs at this time    [place], MSW, LCSW 09/15/2023 11:06 AM

## 2023-09-15 NOTE — Plan of Care (Signed)

## 2023-09-15 NOTE — Progress Notes (Signed)
 PHARMACY - PHYSICIAN COMMUNICATION CRITICAL VALUE ALERT - BLOOD CULTURE IDENTIFICATION (BCID)  Sandra Henderson is an 63 y.o. female who presented to [facility] on 09/14/2023 with a chief complaint of abdominal pain.   Assessment:  Medical history significant for HTN, HLD, IBS, RA, diverticulosis, cholecystectomy and depression/anxiety who initially presented to the urgent care for evaluation of LLQ pain, fevers, chills, nausea and vomiting and poor oral intake over the last 5 days and admitted for acute kidney injury.  5/5 lactic acid WNL Today, patient is afebrile and WBC WNL.  5/5 BCx: 1 out of 4 with gram neg rods; BCID detected E.Coli  Name of physician (or Provider) Contacted: Yates  Current antibiotics: none  Changes to prescribed antibiotics recommended:  Suspected contaminant Patient is not on any antibiotics - No changes needed  Results for orders placed or performed during the hospital encounter of 09/14/23  Blood Culture ID Panel (Reflexed) (Collected: 09/14/2023  2:59 PM)  Result Value Ref Range   Enterococcus faecalis NOT DETECTED NOT DETECTED   Enterococcus Faecium NOT DETECTED NOT DETECTED   Listeria monocytogenes NOT DETECTED NOT DETECTED   Staphylococcus species NOT DETECTED NOT DETECTED   Staphylococcus aureus (BCID) NOT DETECTED NOT DETECTED   Staphylococcus epidermidis NOT DETECTED NOT DETECTED   Staphylococcus lugdunensis NOT DETECTED NOT DETECTED   Streptococcus species NOT DETECTED NOT DETECTED   Streptococcus agalactiae NOT DETECTED NOT DETECTED   Streptococcus pneumoniae NOT DETECTED NOT DETECTED   Streptococcus pyogenes NOT DETECTED NOT DETECTED   A.calcoaceticus-baumannii NOT DETECTED NOT DETECTED   Bacteroides fragilis NOT DETECTED NOT DETECTED   Enterobacterales DETECTED (A) NOT DETECTED   Enterobacter cloacae complex NOT DETECTED NOT DETECTED   Escherichia coli DETECTED (A) NOT DETECTED   Klebsiella aerogenes NOT DETECTED NOT DETECTED   Klebsiella  oxytoca NOT DETECTED NOT DETECTED   Klebsiella pneumoniae NOT DETECTED NOT DETECTED   Proteus species NOT DETECTED NOT DETECTED   Salmonella species NOT DETECTED NOT DETECTED   Serratia marcescens NOT DETECTED NOT DETECTED   Haemophilus influenzae NOT DETECTED NOT DETECTED   Neisseria meningitidis NOT DETECTED NOT DETECTED   Pseudomonas aeruginosa NOT DETECTED NOT DETECTED   Stenotrophomonas maltophilia NOT DETECTED NOT DETECTED   Candida albicans NOT DETECTED NOT DETECTED   Candida auris NOT DETECTED NOT DETECTED   Candida glabrata NOT DETECTED NOT DETECTED   Candida krusei NOT DETECTED NOT DETECTED   Candida parapsilosis NOT DETECTED NOT DETECTED   Candida tropicalis NOT DETECTED NOT DETECTED   Cryptococcus neoformans/gattii NOT DETECTED NOT DETECTED   CTX-M ESBL NOT DETECTED NOT DETECTED   Carbapenem resistance IMP NOT DETECTED NOT DETECTED   Carbapenem resistance KPC NOT DETECTED NOT DETECTED   Carbapenem resistance NDM NOT DETECTED NOT DETECTED   Carbapenem resist OXA 48 LIKE NOT DETECTED NOT DETECTED   Carbapenem resistance VIM NOT DETECTED NOT DETECTED    Delpha Fickle 09/15/2023  10:08 AM

## 2023-09-15 NOTE — Plan of Care (Signed)
  Problem: Education: Goal: Knowledge of General Education information will improve Description: Including pain rating scale, medication(s)/side effects and non-pharmacologic comfort measures Outcome: Progressing   Problem: Health Behavior/Discharge Planning: Goal: Ability to manage health-related needs will improve Outcome: Progressing   Problem: Clinical Measurements: Goal: Ability to maintain clinical measurements within normal limits will improve Outcome: Progressing Goal: Will remain free from infection Outcome: Progressing Goal: Diagnostic test results will improve Outcome: Progressing   Problem: Activity: Goal: Risk for activity intolerance will decrease Outcome: Progressing   Problem: Nutrition: Goal: Adequate nutrition will be maintained Outcome: Progressing   Problem: Coping: Goal: Level of anxiety will decrease Outcome: Progressing   Problem: Elimination: Goal: Will not experience complications related to bowel motility Outcome: Progressing Goal: Will not experience complications related to urinary retention Outcome: Progressing   Problem: Pain Managment: Goal: General experience of comfort will improve and/or be controlled Outcome: Progressing   Problem: Safety: Goal: Ability to remain free from injury will improve Outcome: Progressing

## 2023-09-15 NOTE — Progress Notes (Signed)
 Mobility Specialist - Progress Note   09/15/23 1452  Mobility  Activity Ambulated independently in hallway  Level of Assistance Independent  Assistive Device None  Distance Ambulated (ft) 500 ft  Activity Response Tolerated well  Mobility Referral Yes  Mobility visit 1 Mobility  Mobility Specialist Start Time (ACUTE ONLY) 1445  Mobility Specialist Stop Time (ACUTE ONLY) 1451  Mobility Specialist Time Calculation (min) (ACUTE ONLY) 6 min   Pt received in bed and agreeable to mobility. No complaints during session. Pt to bed after session with all needs met.    [facility]

## 2023-09-15 NOTE — Hospital Course (Signed)
 62yo with h/o HTN, HLD, RA, and depression/anxiety who presented on 5/5 with abdominal pain.  She was found to have BUN 50/Creatinine 4.5 (12/0.73 on 05/16/2021), unremarkable CT A/P.  She was admitted for evaluation of AKI.

## 2023-09-15 NOTE — Progress Notes (Signed)
 Progress Note   Patient: Sandra Henderson LKG:[id number] DOB: Aug 17, 1960 DOA: 09/14/2023     0 DOS: the patient was seen and examined on 09/15/2023   Brief hospital course: 62yo with h/o HTN, HLD, RA, and depression/anxiety who presented on 5/5 with abdominal pain.  She was found to have BUN 50/Creatinine 4.5 (12/0.73 on 05/16/2021), unremarkable CT A/P.  She was admitted for evaluation of AKI.  Assessment and Plan:  Acute renal failure Presented after 5 days of decreased p.o. intake in the setting of abd pain, N/V and constipation. Significant elevation in BUN/creatinine to 50/4.50 from 14/0.73 on 06/09/2023 from PCP office CT A/P shows small nonobstructive renal calculi but no hydronephrosis or ureterolithiasis This is likely acute kidney injury in the setting of dehydration + ATN (NSAIDs + metformin  + hydrochlorothiazide  + ARB) Status post IV NS 1 L bolus x 2 in the ED Continue IV hydration with start IV NS at 100 cc/h Reports that she is urinating very well and acidosis has improved Avoid nephrotoxic agents Renal function is so far slow to respond but hopefully will start to improve in the next few days Renal US  considered but not thought to be needed by nephrology at this time Nephrology consultation is appreciated   Constipation/Hx of diverticulosis Patient with documented history of IBS presented with 5 days of constipation CT A/P negative for diverticulitis or significant stool burden Patient still feel bloated and mild abdominal discomfort but no significant pain Started MiraLAX daily and Senokot-S at bedtime, escalate as needed for BM   Elevated LFTs CMP shows alk phos 168, AST/ALT 53/60 but normal bilirubin Pt w/ hx of cholecystectomy and CT A/P with no acute pathology in the hepatobiliary tract Likely in the setting of dehydration Follow-up acute hepatitis panel Trend LFTs   HTN BP stable with BP 96/63-146/75, currently 114/77 Continue amlodipine  Hold olmesartan  and  hydrochlorothiazide  in the setting of AKI   HLD Continue rosuvastatin and Lovaza   Anxiety and depression Continue bupropion and as needed Xanax    GERD Continue PPI  Class 2 obesity Body mass index is 38.78 kg/m.Aaron Aas  Weight loss should be encouraged Outpatient PCP/bariatric medicine f/u encouraged Significantly low or high BMI is associated with higher medical risk including morbidity and mortality     Consultants: Nephrology TOC team  Procedures: None  Antibiotics: None      Subjective: Feeling ok, making a lot of urine.   Objective: Vitals:   09/15/23 0537 09/15/23 1229  BP: 134/68 114/77  Pulse: 88 88  Resp: 18 16  Temp: 98.6 F (37 C) 98.1 F (36.7 C)  SpO2: 98% 98%    Intake/Output Summary (Last 24 hours) at 09/15/2023 1805 Last data filed at 09/15/2023 1700 Gross per 24 hour  Intake 2011.57 ml  Output --  Net 2011.57 ml   Filed Weights   09/14/23 0908  Weight: 99.3 kg    Exam:  General:  Appears calm and comfortable and is in NAD Eyes:  EOMI, normal lids, iris ENT:  grossly normal hearing, lips & tongue, mmm Neck:  no LAD, masses or thyromegaly Cardiovascular:  RRR. No LE edema.  Respiratory:   CTA bilaterally with no wheezes/rales/rhonchi.  Normal respiratory effort. Abdomen:  soft, NT, ND Skin:  no rash or induration seen on limited exam Musculoskeletal:  grossly normal tone BUE/BLE, good ROM, no bony abnormality Psychiatric:  grossly normal mood and affect, speech fluent and appropriate, AOx3 Neurologic:  CN 2-12 grossly intact, moves all extremities in coordinated fashion  Data Reviewed: I have reviewed the patient's lab results since admission.  Pertinent labs for today include:   CO2 21, improved BUN 57/Creatinine 4.7/GFR 10, up from 50/4.5/10 on 5/5 Albumin 2.8 WBC 9.9 Hgb 11.5     Family Communication: Husband was present throughout evaluation  Disposition: Status is: Inpatietn Admit - It is my clinical opinion that  admission to INPATIENT is reasonable and necessary because of the expectation that this patient will require hospital care that crosses at least 2 midnights to treat this condition based on the medical complexity of the problems presented.  Given the aforementioned information, the predictability of an adverse outcome is felt to be significant.      Time spent: 50 minutes  Unresulted Labs (From admission, onward)     Start     Ordered   09/16/23 0500  Renal function panel  Tomorrow morning,   R        09/15/23 1338   09/16/23 0500  CBC  Tomorrow morning,   R        09/15/23 1338   09/14/23 1430  Hepatitis panel, acute  Once,   URGENT        09/14/23 1430             Author: Lorita Rosa, MD 09/15/2023 6:05 PM  For on call review [email or web address].

## 2023-09-15 NOTE — Consult Note (Signed)
 [facility]  INPATIENT CONSULTATION  Reason for Consultation: AKI Requesting Provider: Dr. Murrel Arnt  HPI: Sandra Henderson is an 63 y.o. female with GERD, HL, HTN, IBS, RA, h/o nephrolithiasis, obesity, anxiety/depression currently admitted for abdominal pain and nephrology is consulted for AKI.   Pt seen in urgent care yesterday for 5d h/o LLQ pain, F/C/N/V/poor po intake/constipation.  Tm at home 103.  She though she was having UTI so too macrobid Friday and Sat.  VS afebrile, BP 104-146 since admission - says was 90/70 at urgent care (she's typically 130/80 on meds).   CT showed no acute inflammatory process, BL nonobstructive stones. Labs 138,  K 3.6, Bicarb 18, BUN 50, Cr 4.5, WBC 12.2, Hb 13, Plt 339.  Labs with UA +blood, 1+ protein, no RBC on microscopy, no WBC or bacteria.  She was given 1L bolus fluids + MIVF.   No UOP documented but she says she is making a fairly normal amount; it was a bit less over weekend but today ok.  It's yellow may be a little foamy but no tea or cola colored urine..  Lab sthis AM normal lytes BUN 57, Cr 4.7, WBC 9.9, hb 11.5, Plt 328.    She had 2 normal BMs today. Never had diarrhea.   Outpt meds prior to arrival included: hydrochlorothiazide , olmesartan , was taking a few daily doses of motrin while febrile.  No rashes.   PMH: Past Medical History:  Diagnosis Date   Anxiety    Back pain    Chest pain    Depression    Edema, lower extremity    Family history of adverse reaction to anesthesia    "Mom's blood pressure bottoms out"    Gallbladder disease    GERD (gastroesophageal reflux disease)    Hemorrhoid    Hyperlipidemia    Hypertension    IBS (irritable bowel syndrome)    Joint pain    Kidney stones    Obesity    Rheumatoid arthritis (HCC)    Shortness of breath    SUI (stress urinary incontinence, female)    Vitamin D  deficiency    PSH: Past Surgical History:  Procedure Laterality Date   CHOLECYSTECTOMY OPEN  ~ 1988    COLONOSCOPY WITH PROPOFOL  N/A 06/19/2015   Procedure: COLONOSCOPY WITH PROPOFOL ;  Surgeon: Garrett Kallman, MD;  Location: [facility];  Service: Endoscopy;  Laterality: N/A;   ESOPHAGOGASTRODUODENOSCOPY (EGD) WITH PROPOFOL  N/A 06/19/2015   Procedure: ESOPHAGOGASTRODUODENOSCOPY (EGD) WITH PROPOFOL ;  Surgeon: Garrett Kallman, MD;  Location: [facility];  Service: Endoscopy;  Laterality: N/A;   TONSILLECTOMY  1960's   Past Medical History:  Diagnosis Date   Anxiety    Back pain    Chest pain    Depression    Edema, lower extremity    Family history of adverse reaction to anesthesia    "Mom's blood pressure bottoms out"    Gallbladder disease    GERD (gastroesophageal reflux disease)    Hemorrhoid    Hyperlipidemia    Hypertension    IBS (irritable bowel syndrome)    Joint pain    Kidney stones    Obesity    Rheumatoid arthritis (HCC)    Shortness of breath    SUI (stress urinary incontinence, female)    Vitamin D  deficiency     Medications:  I have reviewed the patient's current medications.  Medications Prior to Admission  Medication Sig Dispense Refill   acetaminophen  (TYLENOL ) 325 MG tablet Take 325 mg  by mouth every 6 (six) hours as needed for moderate pain or headache.     ALPRAZolam  (XANAX ) 1 MG tablet Take 0.5-1 mg by mouth daily as needed for anxiety or sleep.     amLODipine  (NORVASC ) 10 MG tablet Take 10 mg by mouth at bedtime.     aspirin EC 81 MG tablet Take 325 mg by mouth at bedtime.     AZO-CRANBERRY PO Take 2 tablets by mouth as needed.     buPROPion (WELLBUTRIN SR) 150 MG 12 hr tablet Take 150 mg by mouth every morning.     Cholecalciferol (QC VITAMIN D3) 50 MCG (2000 UT) TABS Take 2,000 Units by mouth in the morning.     hydrochlorothiazide  (HYDRODIURIL ) 12.5 MG tablet Take 1 tablet (12.5 mg total) by mouth daily. (Patient taking differently: Take 25 mg by mouth in the morning.) 30 tablet 0   hydrocortisone 2.5 % cream Apply 1 Application topically as needed.      ibuprofen (ADVIL,MOTRIN) 200 MG tablet Take 400 mg by mouth every 6 (six) hours as needed (Pain).     metFORMIN  (GLUCOPHAGE -XR) 500 MG 24 hr tablet Take 500 mg by mouth in the morning.     Multiple Vitamins-Minerals (CENTRUM SILVER 50+WOMEN PO) Take 1 tablet by mouth in the morning.     NITROFURANTOIN MACROCRYSTAL PO Take 25 mg by mouth as needed.     Nystatin (NYAMYC) 100000 UNIT/GM POWD Apply 1 application  topically daily.  0   olmesartan  (BENICAR ) 40 MG tablet Take 40 mg by mouth at bedtime.     Omega-3 Fatty Acids (FISH OIL) 1200 MG CAPS Take 1 capsule by mouth in the morning.     omeprazole (PRILOSEC) 20 MG capsule Take 20 mg by mouth in the morning.     Polyethyl Glycol-Propyl Glycol (SYSTANE OP) Place 1-2 drops into both eyes daily. Instill 1-2 drops into both eyes once daily. May use 1-2 drops at night if needed.     POTASSIUM GLUCONATE PO Take 1 tablet by mouth as needed.     rosuvastatin (CRESTOR) 40 MG tablet Take 40 mg by mouth at bedtime.     sodium chloride  (OCEAN) 0.65 % SOLN nasal spray Place 1 spray into both nostrils as needed for congestion.      ALLERGIES:  No Known Allergies  FAM HX: Family History  Problem Relation Age of Onset   Cancer Mother    Hypertension Mother    Hyperlipidemia Mother    Diabetes Mother    Sleep apnea Mother    Thyroid  disease Mother    Obesity Mother    Cancer Father        STOMACH   Cancer Daughter     Social History:   reports that she has never smoked. She has never used smokeless tobacco. She reports current alcohol  use of about 22.0 standard drinks of alcohol  per week. She reports that she does not use drugs.  ROS: 12 system ROS neg except per HPI  Blood pressure 114/77, pulse 88, temperature 98.1 F (36.7 C), temperature source Oral, resp. rate 16, height 5\' 3"  (1.6 m), weight 99.3 kg, last menstrual period 06/12/2018, SpO2 98%. PHYSICAL EXAM: Gen: obese woman who is anxious but comfortable  Eyes: anicteric ENT:  MMM Neck: supple CV:  RRR, no rub Abd: soft, obese Lungs: normal WOB on RA GU: no raoley Extr:  no edema Neuro: nonfocal Skin: no rashes   Results for orders placed or performed during the hospital encounter of  09/14/23 (from the past 48 hours)  Urinalysis, Routine w reflex microscopic -Urine, Clean Catch     Status: Abnormal   Collection Time: 09/14/23  9:06 AM  Result Value Ref Range   Color, Urine YELLOW YELLOW   APPearance HAZY (A) CLEAR   Specific Gravity, Urine 1.020 1.005 - 1.030   pH 5.5 5.0 - 8.0   Glucose, UA NEGATIVE NEGATIVE mg/dL   Hgb urine dipstick SMALL (A) NEGATIVE   Bilirubin Urine SMALL (A) NEGATIVE   Ketones, ur NEGATIVE NEGATIVE mg/dL   Protein, ur 161 (A) NEGATIVE mg/dL   Nitrite NEGATIVE NEGATIVE   Leukocytes,Ua NEGATIVE NEGATIVE    Comment: Performed at [facility], [address] [place], [place] [address]  Urinalysis, Microscopic (reflex)     Status: Abnormal   Collection Time: 09/14/23  9:06 AM  Result Value Ref Range   RBC / HPF 0-5 0 - 5 RBC/hpf   WBC, UA 0-5 0 - 5 WBC/hpf   Bacteria, UA RARE (A) NONE SEEN   Squamous Epithelial / HPF 0-5 0 - 5 /HPF   Amorphous Crystal PRESENT     Comment: Performed at [facility], [address] [place], [place] [address]  CBC with Differential     Status: Abnormal   Collection Time: 09/14/23  9:44 AM  Result Value Ref Range   WBC 12.2 (H) 4.0 - 10.5 K/uL   RBC 4.05 3.87 - 5.11 MIL/uL   Hemoglobin 13.0 12.0 - 15.0 g/dL   HCT 11.9 14.7 - 82.9 %   MCV 91.1 80.0 - 100.0 fL   MCH 32.1 26.0 - 34.0 pg   MCHC 35.2 30.0 - 36.0 g/dL   RDW 56.2 13.0 - 86.5 %   Platelets 339 150 - 400 K/uL   nRBC 0.0 0.0 - 0.2 %   Neutrophils Relative % 82 %   Neutro Abs 10.0 (H) 1.7 - 7.7 K/uL   Lymphocytes Relative 7 %   Lymphs Abs 0.9 0.7 - 4.0 K/uL   Monocytes Relative 10 %   Monocytes Absolute 1.2 (H) 0.1 - 1.0 K/uL   Eosinophils Relative 0 %   Eosinophils Absolute 0.1 0.0 - 0.5 K/uL   Basophils  Relative 0 %   Basophils Absolute 0.0 0.0 - 0.1 K/uL   Immature Granulocytes 1 %   Abs Immature Granulocytes 0.11 (H) 0.00 - 0.07 K/uL    Comment: Performed at [facility], [address] [place], [place] [address]  Comprehensive metabolic panel     Status: Abnormal   Collection Time: 09/14/23  9:44 AM  Result Value Ref Range   Sodium 134 (L) 135 - 145 mmol/L    Comment: Performed at [facility], [address] [place], [place] [address]   Potassium 3.6 3.5 - 5.1 mmol/L    Comment: Performed at [facility], [address] [place], [place] [address]   Chloride 97 (L) 98 - 111 mmol/L    Comment: Performed at [facility], [address] [place], [place] [address]   CO2 18 (L) 22 - 32 mmol/L    Comment: Performed at [facility], [address] [place], [place] [address]   Glucose, Bld 113 (H) 70 - 99 mg/dL    Comment: Glucose reference range applies only to samples taken after fasting for at least 8 hours. Performed at [facility], [address] [place], [place] [address]    BUN 50 (H) 8 - 23 mg/dL    Comment: Performed at [facility], [address] [place], [place] [address]   Creatinine, Ser 4.50 (H) 0.44 - 1.00 mg/dL    Comment: Performed at [facility], [address] [place], [place] [address]   Calcium 9.9 8.9 - 10.3 mg/dL    Comment: Performed at [facility], [address] [place], [place] [address]   Total Protein 7.8 6.5 - 8.1 g/dL    Comment: Performed at [facility], [address] [place], [place] [address]   Albumin 3.9 3.5 - 5.0 g/dL    Comment: Performed at [facility], [address] [place], [place] [address]   AST 53 (H) 15 - 41 U/L    Comment: Performed at [facility], [address] [place], [place] [address]   ALT 60 (H) 0 - 44 U/L    Comment: Performed at [facility], [address] [place], [place] [address]   Alkaline Phosphatase 168 (H) 38 - 126 U/L    Comment: Performed at [facility], [address] [place], [place] [address]   Total Bilirubin 0.7 0.0 - 1.2 mg/dL    Comment: Performed at [facility], [address], [place], [place] [address]   GFR, Estimated 10 (L) >60 mL/min    Comment: (NOTE) Calculated using the CKD-EPI Creatinine Equation (2021)    Anion gap 19 (H) 5 - 15    Comment: Performed at [facility], [address] [place], [place] [address]  Lipase, blood     Status: None   Collection Time: 09/14/23  9:44 AM  Result Value Ref Range   Lipase 36 11 - 51 U/L    Comment: Performed at [facility], [address] [place], [place] [address]  Creatinine, serum     Status: Abnormal   Collection Time: 09/14/23  1:09 PM  Result Value Ref Range   Creatinine, Ser 4.77 (H) 0.44 - 1.00 mg/dL   GFR, Estimated 10 (L) >60 mL/min    Comment: (NOTE) Calculated using the CKD-EPI Creatinine Equation (2021) Performed at [facility], [address] [place], [place] [address]   Lactic acid, plasma     Status: None   Collection Time: 09/14/23  2:30 PM  Result Value Ref Range   Lactic Acid, Venous 0.6 0.5 - 1.9 mmol/L    Comment: Performed at [facility], [address] [place], [place] [address]  Blood culture (routine x 2)     Status: None (Preliminary result)   Collection Time: 09/14/23  2:50 PM   Specimen: BLOOD  Result Value Ref Range   Specimen Description      BLOOD RIGHT ANTECUBITAL Performed at [facility], [address] [place], [place] [address]    Special Requests      BOTTLES DRAWN AEROBIC ONLY Blood Culture adequate volume Performed at [facility], [address] [place], [place] [address]    Culture      NO GROWTH < 24 HOURS Performed at [facility], [address], [place], [place] [address]    Report Status PENDING   Blood  culture (routine x 2)     Status: None (Preliminary result)  Collection Time: 09/14/23  2:59 PM   Specimen: BLOOD LEFT FOREARM  Result Value Ref Range   Specimen Description      BLOOD LEFT FOREARM Performed at [facility], [address], [place], [place] [address]    Special Requests      BOTTLES DRAWN AEROBIC AND ANAEROBIC Blood Culture adequate volume Performed at [facility], [address] [place], [place] [address]    Culture  Setup Time      GRAM NEGATIVE RODS ANAEROBIC BOTTLE ONLY CRITICAL RESULT CALLED TO, READ BACK BY AND VERIFIED WITH: PHARMD Caroleen Churn [id number] AT 958 AM BY CM Performed at [facility], [address] [place], [place] [address]    Culture GRAM NEGATIVE RODS    Report Status PENDING   Blood Culture ID Panel (Reflexed)     Status: Abnormal   Collection Time: 09/14/23  2:59 PM  Result Value Ref Range   Enterococcus faecalis NOT DETECTED NOT DETECTED   Enterococcus Faecium NOT DETECTED NOT DETECTED   Listeria monocytogenes NOT DETECTED NOT DETECTED   Staphylococcus species NOT DETECTED NOT DETECTED   Staphylococcus aureus (BCID) NOT DETECTED NOT DETECTED   Staphylococcus epidermidis NOT DETECTED NOT DETECTED   Staphylococcus lugdunensis NOT DETECTED NOT DETECTED   Streptococcus species NOT DETECTED NOT DETECTED   Streptococcus agalactiae NOT DETECTED NOT DETECTED   Streptococcus pneumoniae NOT DETECTED NOT DETECTED   Streptococcus pyogenes NOT DETECTED NOT DETECTED   A.calcoaceticus-baumannii NOT DETECTED NOT DETECTED   Bacteroides fragilis NOT DETECTED NOT DETECTED   Enterobacterales DETECTED (A) NOT DETECTED    Comment: Enterobacterales represent a large order of gram negative bacteria, not a single organism. CRITICAL RESULT CALLED TO, READ BACK BY AND VERIFIED WITH: PHARMD C WINSOR [id number] AT 958 AM BY CM    Enterobacter cloacae complex NOT DETECTED NOT DETECTED   Escherichia coli DETECTED (A) NOT DETECTED    Comment:  CRITICAL RESULT CALLED TO, READ BACK BY AND VERIFIED WITH: PHARMD C WINSOR [id number] AT 958 AM BY CM    Klebsiella aerogenes NOT DETECTED NOT DETECTED   Klebsiella oxytoca NOT DETECTED NOT DETECTED   Klebsiella pneumoniae NOT DETECTED NOT DETECTED   Proteus species NOT DETECTED NOT DETECTED   Salmonella species NOT DETECTED NOT DETECTED   Serratia marcescens NOT DETECTED NOT DETECTED   Haemophilus influenzae NOT DETECTED NOT DETECTED   Neisseria meningitidis NOT DETECTED NOT DETECTED   Pseudomonas aeruginosa NOT DETECTED NOT DETECTED   Stenotrophomonas maltophilia NOT DETECTED NOT DETECTED   Candida albicans NOT DETECTED NOT DETECTED   Candida auris NOT DETECTED NOT DETECTED   Candida glabrata NOT DETECTED NOT DETECTED   Candida krusei NOT DETECTED NOT DETECTED   Candida parapsilosis NOT DETECTED NOT DETECTED   Candida tropicalis NOT DETECTED NOT DETECTED   Cryptococcus neoformans/gattii NOT DETECTED NOT DETECTED   CTX-M ESBL NOT DETECTED NOT DETECTED   Carbapenem resistance IMP NOT DETECTED NOT DETECTED   Carbapenem resistance KPC NOT DETECTED NOT DETECTED   Carbapenem resistance NDM NOT DETECTED NOT DETECTED   Carbapenem resist OXA 48 LIKE NOT DETECTED NOT DETECTED   Carbapenem resistance VIM NOT DETECTED NOT DETECTED    Comment: Performed at [facility], [address] [place], [place] [address]  Magnesium     Status: None   Collection Time: 09/14/23  3:10 PM  Result Value Ref Range   Magnesium 2.4 1.7 - 2.4 mg/dL    Comment: Performed at [facility]  [address] [place], [place] [address]  Phosphorus     Status: Abnormal   Collection Time: 09/14/23  3:10 PM  Result Value Ref Range   Phosphorus 5.0 (H) 2.5 - 4.6 mg/dL    Comment: Performed at [facility], [address] [place], [place] [address]  CK     Status: None   Collection Time: 09/14/23  3:10 PM  Result Value Ref Range   Total CK 127 38 - 234 U/L    Comment: Performed at [facility], [address] [place], [place] [address]  HIV Antibody (routine testing w rflx)     Status: None   Collection Time: 09/14/23  6:58 PM  Result Value Ref Range   HIV Screen 4th Generation wRfx Non Reactive Non Reactive    Comment: Performed at [facility], [address] [place], [place] [address]  CBC     Status: Abnormal   Collection Time: 09/15/23  6:12 AM  Result Value Ref Range   WBC 9.9 4.0 - 10.5 K/uL   RBC 3.66 (L) 3.87 - 5.11 MIL/uL   Hemoglobin 11.5 (L) 12.0 - 15.0 g/dL   HCT 10.2 (L) 72.5 - 36.6 %   MCV 94.5 80.0 - 100.0 fL   MCH 31.4 26.0 - 34.0 pg   MCHC 33.2 30.0 - 36.0 g/dL   RDW 44.0 34.7 - 42.5 %   Platelets 328 150 - 400 K/uL   nRBC 0.0 0.0 - 0.2 %    Comment: Performed at [facility], [address], [place], [place] [address]  Renal function panel     Status: Abnormal   Collection Time: 09/15/23  6:12 AM  Result Value Ref Range   Sodium 138 135 - 145 mmol/L   Potassium 3.6 3.5 - 5.1 mmol/L   Chloride 107 98 - 111 mmol/L   CO2 21 (L) 22 - 32 mmol/L   Glucose, Bld 102 (H) 70 - 99 mg/dL    Comment: Glucose reference range applies only to samples taken after fasting for at least 8 hours.   BUN 57 (H) 8 - 23 mg/dL   Creatinine, Ser 7.56 (H) 0.44 - 1.00 mg/dL   Calcium 8.8 (L) 8.9 - 10.3 mg/dL   Phosphorus 4.1 2.5 - 4.6 mg/dL   Albumin 2.8 (L) 3.5 - 5.0 g/dL   GFR, Estimated 10 (L) >60 mL/min    Comment: (NOTE) Calculated using the CKD-EPI Creatinine Equation (2021)    Anion gap 10 5 - 15    Comment: Performed at [facility], [address], [place], [place] [address]    CT ABDOMEN PELVIS WO CONTRAST Result Date: 09/14/2023 CLINICAL DATA:  LLQ abdominal pain. EXAM: CT ABDOMEN AND PELVIS WITHOUT CONTRAST TECHNIQUE: Multidetector CT imaging of the abdomen and pelvis was performed following the standard protocol without IV contrast. RADIATION DOSE REDUCTION: This exam was performed according to the  departmental dose-optimization program which includes automated exposure control, adjustment of the mA and/or kV according to patient size and/or use of iterative reconstruction technique. COMPARISON:  None Available. FINDINGS: Lower chest: There are patchy atelectatic changes at the left lung base. Bilateral lungs are otherwise clear. No overt consolidation. No pleural effusion. The heart is normal in size. No pericardial effusion. Hepatobiliary: The liver is normal in size. Non-cirrhotic configuration. No suspicious mass. There is a subcapsular 7 x 10 mm focus in the right hepatic lobe, segment 5, which  is too small to adequately characterize. No intrahepatic or extrahepatic bile duct dilation. Gallbladder is surgically absent. Pancreas: Unremarkable. No pancreatic ductal dilatation or surrounding inflammatory changes. Spleen: Within normal limits. No focal lesion. Adrenals/Urinary Tract: Adrenal glands are unremarkable. No suspicious renal mass within the limitations of this unenhanced exam. There is a 4 mm nonobstructing calculus in the left kidney upper pole and a 3 mm nonobstructing calculus in the right kidney lower pole. No ureterolithiasis or obstructive uropathy on either side. Urinary bladder is under distended, precluding optimal assessment. However, no large mass or stones identified. No perivesical fat stranding. Stomach/Bowel: No disproportionate dilation of the small or large bowel loops. No evidence of abnormal bowel wall thickening or inflammatory changes. The appendix is unremarkable. There are multiple diverticula throughout the colon, without imaging signs of diverticulitis. Vascular/Lymphatic: No ascites or pneumoperitoneum. No abdominal or pelvic lymphadenopathy, by size criteria. No aneurysmal dilation of the major abdominal arteries. There are mild peripheral atherosclerotic vascular calcifications of the aorta and its major branches. Reproductive: The uterus is unremarkable. No large  adnexal mass. Other: There is a tiny fat containing umbilical hernia. There is a small right paramedian fat containing epigastric ventral hernia with narrow neck. No herniation of bowel loops. The soft tissues and abdominal wall are otherwise unremarkable. Musculoskeletal: No suspicious osseous lesions. There are mild multilevel degenerative changes in the visualized spine. IMPRESSION: 1. No acute inflammatory process identified within the abdomen or pelvis. 2. There are bilateral nonobstructing renal calculi, as described above. No ureterolithiasis or obstructive uropathy on either side. 3. Multiple other nonacute observations, as described above. Aortic Atherosclerosis (ICD10-I70.0). Electronically Signed   By: Beula Brunswick M.D.   On: 09/14/2023 13:42    Assessment/PlanRegina S Henderson is an 63 y.o. female with GERD, HL, HTN, IBS, RA, h/o nephrolithiasis, obesity, anxiety/depression currently admitted for abdominal pain and nephrology is consulted for AKI.   **Abd pain/N/V: suspect she had a viral process that led to fevers, GI issues that passed prior to imaging.  Symptoms improved.  **AKI, oliguric:  Baseline Cr 0.7 in 05/2023 [facility]).  By clinical course suspect prerenal progressed to ATN in setting of N/V + ARB + NSAID + hydrochlorothiazide . Imaging small stones but no obstruction o/w kidneys normal.  By her report nonoliguric but just now ordering I/Os.  Continue MIVF overnight.  AM labs.  No indications for RRT - discussed today.  Given nonoliguric suspect she'll improve in the next few days - no need to transfer to [facility] at this time.  Cont to hold ARB and hydrochlorothiazide .    **mild transaminitis:  imaging unrevealing, primary trending.  Says drinks 3-4 glasses wine/night - aware of need to cut back, denies symptoms of withdrawal.   **HTN:  normotensive.  Hydrochlorothiazide  and olmesartan  on hold.  On amlodipine .   **HL: on statin, CK normal  WIll follow, reach out with concerns.    Baron Border 09/15/2023, 1:06 PM

## 2023-09-16 DIAGNOSIS — N179 Acute kidney failure, unspecified: Secondary | ICD-10-CM | POA: Diagnosis not present

## 2023-09-16 LAB — RENAL FUNCTION PANEL
Albumin: 3 g/dL — ABNORMAL LOW (ref 3.5–5.0)
Anion gap: 12 (ref 5–15)
BUN: 57 mg/dL — ABNORMAL HIGH (ref 8–23)
CO2: 18 mmol/L — ABNORMAL LOW (ref 22–32)
Calcium: 9 mg/dL (ref 8.9–10.3)
Chloride: 105 mmol/L (ref 98–111)
Creatinine, Ser: 4.22 mg/dL — ABNORMAL HIGH (ref 0.44–1.00)
GFR, Estimated: 11 mL/min — ABNORMAL LOW (ref >60)
Glucose, Bld: 109 mg/dL — ABNORMAL HIGH (ref 70–99)
Phosphorus: 4.5 mg/dL (ref 2.5–4.6)
Potassium: 3.5 mmol/L (ref 3.5–5.1)
Sodium: 135 mmol/L (ref 135–145)

## 2023-09-16 LAB — CBC
HCT: 36.1 % (ref 36.0–46.0)
Hemoglobin: 11.7 g/dL — ABNORMAL LOW (ref 12.0–15.0)
MCH: 31.4 pg (ref 26.0–34.0)
MCHC: 32.4 g/dL (ref 30.0–36.0)
MCV: 96.8 fL (ref 80.0–100.0)
Platelets: 379 10*3/uL (ref 150–400)
RBC: 3.73 MIL/uL — ABNORMAL LOW (ref 3.87–5.11)
RDW: 13 % (ref 11.5–15.5)
WBC: 8.8 10*3/uL (ref 4.0–10.5)
nRBC: 0 % (ref 0.0–0.2)

## 2023-09-16 MED ORDER — SACCHAROMYCES BOULARDII 250 MG PO CAPS
250.0000 mg | ORAL_CAPSULE | Freq: Two times a day (BID) | ORAL | Status: DC
  Administered 2023-09-16 – 2023-09-17 (×2): 250 mg via ORAL
  Filled 2023-09-16 (×2): qty 1

## 2023-09-16 MED ORDER — SODIUM CHLORIDE 0.9 % IV SOLN
2.0000 g | Freq: Every day | INTRAVENOUS | Status: DC
  Administered 2023-09-16 – 2023-09-17 (×2): 2 g via INTRAVENOUS
  Filled 2023-09-16 (×2): qty 20

## 2023-09-16 NOTE — Plan of Care (Signed)

## 2023-09-16 NOTE — Progress Notes (Signed)
 [facility] Progress Note   Subjective:   Feels well.  UOP is great 3L, po intake good.  Afebrile.  No new issues.  +bld cx E coli and enterococcus, on IV abs.   Objective Vitals:   09/15/23 1229 09/15/23 2019 09/16/23 0442 09/16/23 0500  BP: 114/77 129/76 123/82   Pulse: 88 82 75   Resp: 16 20 18    Temp: 98.1 F (36.7 C) 98.1 F (36.7 C) 98 F (36.7 C)   TempSrc: Oral Oral Oral   SpO2: 98% 99% 97%   Weight:    102 kg  Height:       Physical Exam General: obese, appears tired but well Lungs: normal WOB on RA Abdomen: obese Extremities: no edema Neuro: nonfocal:   Additional Objective Labs: Basic Metabolic Panel: Recent Labs  Lab 09/14/23 0944 09/14/23 1309 09/14/23 1510 09/15/23 0612 09/16/23 0524  NA 134*  --   --  138 135  K 3.6  --   --  3.6 3.5  CL 97*  --   --  107 105  CO2 18*  --   --  21* 18*  GLUCOSE 113*  --   --  102* 109*  BUN 50*  --   --  57* 57*  CREATININE 4.50* 4.77*  --  4.70* 4.22*  CALCIUM 9.9  --   --  8.8* 9.0  PHOS  --   --  5.0* 4.1 4.5   Liver Function Tests: Recent Labs  Lab 09/14/23 0944 09/15/23 0612 09/16/23 0524  AST 53*  --   --   ALT 60*  --   --   ALKPHOS 168*  --   --   BILITOT 0.7  --   --   PROT 7.8  --   --   ALBUMIN 3.9 2.8* 3.0*   Recent Labs  Lab 09/14/23 0944  LIPASE 36   CBC: Recent Labs  Lab 09/14/23 0944 09/15/23 0612 09/16/23 0524  WBC 12.2* 9.9 8.8  NEUTROABS 10.0*  --   --   HGB 13.0 11.5* 11.7*  HCT 36.9 34.6* 36.1  MCV 91.1 94.5 96.8  PLT 339 328 379   Blood Culture    Component Value Date/Time   SDES  09/14/2023 1459    BLOOD LEFT FOREARM Performed at [facility], [address], [place], [place] [address]    [facility]  09/14/2023 1459    BOTTLES DRAWN AEROBIC AND ANAEROBIC Blood Culture adequate volume Performed at [facility], [address] [place], [place] [address]    CULT (A) 09/14/2023 1459    ESCHERICHIA COLI SUSCEPTIBILITIES TO  FOLLOW Performed at [facility], [address], [place], [place] [address]    REPTSTATUS PENDING 09/14/2023 1459    Cardiac Enzymes: Recent Labs  Lab 09/14/23 1510  CKTOTAL 127   CBG: No results for input(s): "GLUCAP" in the last 168 hours. Iron Studies: No results for input(s): "IRON", "TIBC", "TRANSFERRIN", "FERRITIN" in the last 72 hours. @lablastinr3 @ Studies/Results: CT ABDOMEN PELVIS WO CONTRAST Result Date: 09/14/2023 CLINICAL DATA:  LLQ abdominal pain. EXAM: CT ABDOMEN AND PELVIS WITHOUT CONTRAST TECHNIQUE: Multidetector CT imaging of the abdomen and pelvis was performed following the standard protocol without IV contrast. RADIATION DOSE REDUCTION: This exam was performed according to the departmental dose-optimization program which includes automated exposure control, adjustment of the mA and/or kV according to patient size and/or use of iterative reconstruction technique. COMPARISON:  None Available. FINDINGS: Lower chest:  There are patchy atelectatic changes at the left lung base. Bilateral lungs are otherwise clear. No overt consolidation. No pleural effusion. The heart is normal in size. No pericardial effusion. Hepatobiliary: The liver is normal in size. Non-cirrhotic configuration. No suspicious mass. There is a subcapsular 7 x 10 mm focus in the right hepatic lobe, segment 5, which is too small to adequately characterize. No intrahepatic or extrahepatic bile duct dilation. Gallbladder is surgically absent. Pancreas: Unremarkable. No pancreatic ductal dilatation or surrounding inflammatory changes. Spleen: Within normal limits. No focal lesion. Adrenals/Urinary Tract: Adrenal glands are unremarkable. No suspicious renal mass within the limitations of this unenhanced exam. There is a 4 mm nonobstructing calculus in the left kidney upper pole and a 3 mm nonobstructing calculus in the right kidney lower pole. No ureterolithiasis or obstructive uropathy on either side. Urinary  bladder is under distended, precluding optimal assessment. However, no large mass or stones identified. No perivesical fat stranding. Stomach/Bowel: No disproportionate dilation of the small or large bowel loops. No evidence of abnormal bowel wall thickening or inflammatory changes. The appendix is unremarkable. There are multiple diverticula throughout the colon, without imaging signs of diverticulitis. Vascular/Lymphatic: No ascites or pneumoperitoneum. No abdominal or pelvic lymphadenopathy, by size criteria. No aneurysmal dilation of the major abdominal arteries. There are mild peripheral atherosclerotic vascular calcifications of the aorta and its major branches. Reproductive: The uterus is unremarkable. No large adnexal mass. Other: There is a tiny fat containing umbilical hernia. There is a small right paramedian fat containing epigastric ventral hernia with narrow neck. No herniation of bowel loops. The soft tissues and abdominal wall are otherwise unremarkable. Musculoskeletal: No suspicious osseous lesions. There are mild multilevel degenerative changes in the visualized spine. IMPRESSION: 1. No acute inflammatory process identified within the abdomen or pelvis. 2. There are bilateral nonobstructing renal calculi, as described above. No ureterolithiasis or obstructive uropathy on either side. 3. Multiple other nonacute observations, as described above. Aortic Atherosclerosis (ICD10-I70.0). Electronically Signed   By: Beula Brunswick M.D.   On: 09/14/2023 13:42   Medications:  cefTRIAXone (ROCEPHIN)  IV 2 g (09/16/23 1148)    amLODipine   10 mg Oral QHS   aspirin EC  81 mg Oral Daily   buPROPion  150 mg Oral q morning   cholecalciferol  2,000 Units Oral Daily   heparin injection (subcutaneous)  5,000 Units Subcutaneous Q8H   multivitamin with minerals  1 tablet Oral Daily   omega-3 acid ethyl esters  1 g Oral Daily   pantoprazole   40 mg Oral Daily   polyethylene glycol  17 g Oral Daily    rosuvastatin  40 mg Oral Daily   senna-docusate  2 tablet Oral QHS    Assessment/PlanRegina S Henderson is an 63 y.o. female with GERD, HL, HTN, IBS, RA, h/o nephrolithiasis, obesity, anxiety/depression currently admitted for abdominal pain and nephrology is consulted for AKI.    **AKI, oliguric:  Baseline Cr 0.7 in 05/2023 [facility]).  By clinical course suspect prerenal progressed to ATN in setting of N/V + ARB + NSAID + hydrochlorothiazide . Imaging small stones but no obstruction o/w kidneys normal.  She is making great UOP and looks euvolemic.  Kidney function improving with Cr down to 4.2 today. D/c MIVF.  Cont to hold ARB and hydrochlorothiazide  in the short term.  Cont daily labs, avoid nephrotoxins, avoid hypotension.  Maintain euvolemia - po intake should suffice for now.    **Bacteremia:  abx per primary.  ?bacterial enteritis > bacteremia?Aaron Aas  GI  symptoms had been improving prior to imaging but with + blood cultures question if that was the sequence.  Symptoms improved.  **mild transaminitis:  imaging unrevealing, primary trending.  Says drinks 3-4 glasses wine/night - aware of need to cut back, denies symptoms of withdrawal.    **HTN:  normotensive.  Hydrochlorothiazide  and olmesartan  on hold - continue to hold while BP normal.  On amlodipine .   No issue resuming diuretic, ARB outpt if BP up and kidney function improved.     **HL: on statin, CK normal   Will sign off - nothing further to add.  PCP can trend labs - can take up to 4-6 weeks to recover from AKI episode but I suspect she'll improve quicker than that and should be back to normal.  She has my info should I be able to assist.   Adrian Alba MD 09/16/2023, 12:38 PM  [facility] Pager: [phone]

## 2023-09-16 NOTE — Progress Notes (Signed)
 Progress Note   Patient: Sandra Henderson MWU:[id number] DOB: 24-Nov-1960 DOA: 09/14/2023     1 DOS: the patient was seen and examined on 09/16/2023   Brief hospital course: 63yo with h/o HTN, HLD, RA, and depression/anxiety who presented on 5/5 with abdominal pain.  She was found to have BUN 50/Creatinine 4.5 (12/0.73 on 05/16/2021), unremarkable CT A/P.  She was admitted for evaluation of AKI.  Assessment and Plan:  Acute renal failure Presented after 5 days of decreased p.o. intake in the setting of abd pain, N/V and constipation Significant elevation in BUN/creatinine to 50/4.50 from 14/0.73 on 06/09/2023 from PCP office CT A/P shows small nonobstructive renal calculi but no hydronephrosis or ureterolithiasis This is likely acute kidney injury in the setting of dehydration + ATN (NSAIDs + metformin  + hydrochlorothiazide  + ARB) Status post IV NS 1 L bolus x 2 in the ED Continued IV hydration with IV NS at 100 cc/h Reports that she is urinating very well, wants to stop IVF Avoid nephrotoxic agents Renal function was slow to respond but is improved today Renal US  considered but not thought to be needed by nephrology at this time Nephrology consultation is appreciated Possible dc to home 5/8 if she continues to improve  E coli bacteremia Could be contaminant but given fever and symptoms of illness PTA it is reasonable to treat Started on Ceftriaxone Will await sensitivities and taper accordingly Will repeat blood cultures now   Constipation/Hx of diverticulosis Patient with documented history of IBS presented with 5 days of constipation CT A/P negative for diverticulitis or significant stool burden Patient still feel bloated and mild abdominal discomfort but no significant pain Started MiraLAX daily and Senokot-S at bedtime, escalate as needed for BM   Elevated LFTs CMP shows alk phos 168, AST/ALT 53/60 but normal bilirubin Pt w/ hx of cholecystectomy and CT A/P with no acute pathology  in the hepatobiliary tract Likely in the setting of dehydration Follow-up LFTs in AM   HTN BP stable with BP 96/63-146/75, currently 114/77 Continue amlodipine  Hold olmesartan  and hydrochlorothiazide  in the setting of AKI   HLD Continue rosuvastatin and Lovaza   Anxiety and depression Continue bupropion and as needed Xanax    GERD Continue PPI   Class 2 obesity Body mass index is 38.78 kg/m.Aaron Aas  Weight loss should be encouraged Outpatient PCP/bariatric medicine f/u encouraged Significantly low or high BMI is associated with higher medical risk including morbidity and mortality        Consultants: Nephrology TOC team   Procedures: None   Antibiotics: None   30 Day Unplanned Readmission Risk Score    Flowsheet Row ED to Hosp-Admission (Current) from [facility]14/2023 in [facility]  30 Day Unplanned Readmission Risk Score (%) 9.4 Filed at 09/16/2023 0801       This score is the patient's risk of an unplanned readmission within 30 days of being discharged (0 -100%). The score is based on dignosis, age, lab data, medications, orders, and past utilization.   Low:  0-14.9   Medium: 15-21.9   High: 22-29.9   Extreme: 30 and above           Subjective: Feeling well, eager to go home soon.  Urinating very well.   Objective: Vitals:   09/16/23 0442 09/16/23 1347  BP: 123/82 138/80  Pulse: 75 85  Resp: 18 18  Temp: 98 F (36.7 C) 97.9 F (36.6 C)  SpO2: 97% 99%    Intake/Output Summary (Last  24 hours) at 09/16/2023 1640 Last data filed at 09/16/2023 1048 Gross per 24 hour  Intake 3209.57 ml  Output 3975 ml  Net -765.43 ml   Filed Weights   09/14/23 0908 09/16/23 0500  Weight: 99.3 kg 102 kg    Exam:  General:  Appears calm and comfortable and is in NAD Eyes:  EOMI, normal lids, iris ENT:  grossly normal hearing, lips & tongue, mmm Neck:  no LAD, masses or thyromegaly Cardiovascular:  RRR. No LE edema.  Respiratory:    CTA bilaterally with no wheezes/rales/rhonchi.  Normal respiratory effort. Abdomen:  soft, NT, ND Skin:  no rash or induration seen on limited exam Musculoskeletal:  grossly normal tone BUE/BLE, good ROM, no bony abnormality Psychiatric:  grossly normal mood and affect, speech fluent and appropriate, AOx3 Neurologic:  CN 2-12 grossly intact, moves all extremities in coordinated fashion  Data Reviewed: I have reviewed the patient's lab results since admission.  Pertinent labs for today include:   CO2 18 Glucose 109 BUN 57/Creatinine 4.22/GFR 11 Albumin 3.0 Stable CBC BCID + E coli     Family Communication: None present  Disposition: Status is: Inpatient Remains inpatient appropriate because: ongoing monitoring     Time spent: 50 minutes  Unresulted Labs (From admission, onward)     Start     Ordered   09/17/23 0500  CBC with Differential/Platelet  Tomorrow morning,   R       Question:  Specimen collection method  Answer:  Lab=Lab collect   09/16/23 1638   09/17/23 0500  Comprehensive metabolic panel with GFR  Tomorrow morning,   R       Question:  Specimen collection method  Answer:  Lab=Lab collect   09/16/23 1638   09/16/23 1638  Culture, blood (Routine X 2) w Reflex to ID Panel  BLOOD CULTURE X 2,   R (with TIMED occurrences)      09/16/23 1637             Author: Lorita Rosa, MD 09/16/2023 4:40 PM  For on call review [email or web address].

## 2023-09-17 ENCOUNTER — Other Ambulatory Visit (HOSPITAL_COMMUNITY): Payer: Self-pay

## 2023-09-17 DIAGNOSIS — N179 Acute kidney failure, unspecified: Secondary | ICD-10-CM | POA: Diagnosis not present

## 2023-09-17 LAB — CBC WITH DIFFERENTIAL/PLATELET
Abs Immature Granulocytes: 0.05 10*3/uL (ref 0.00–0.07)
Basophils Absolute: 0 10*3/uL (ref 0.0–0.1)
Basophils Relative: 0 %
Eosinophils Absolute: 0.1 10*3/uL (ref 0.0–0.5)
Eosinophils Relative: 1 %
HCT: 40.4 % (ref 36.0–46.0)
Hemoglobin: 13.5 g/dL (ref 12.0–15.0)
Immature Granulocytes: 1 %
Lymphocytes Relative: 18 %
Lymphs Abs: 1.5 10*3/uL (ref 0.7–4.0)
MCH: 28.4 pg (ref 26.0–34.0)
MCHC: 33.4 g/dL (ref 30.0–36.0)
MCV: 84.9 fL (ref 80.0–100.0)
Monocytes Absolute: 0.9 10*3/uL (ref 0.1–1.0)
Monocytes Relative: 10 %
Neutro Abs: 5.8 10*3/uL (ref 1.7–7.7)
Neutrophils Relative %: 70 %
Platelets: 291 10*3/uL (ref 150–400)
RBC: 4.76 MIL/uL (ref 3.87–5.11)
RDW: 12.6 % (ref 11.5–15.5)
WBC: 8.3 10*3/uL (ref 4.0–10.5)
nRBC: 0 % (ref 0.0–0.2)

## 2023-09-17 LAB — COMPREHENSIVE METABOLIC PANEL WITH GFR
ALT: 79 U/L — ABNORMAL HIGH (ref 0–44)
AST: 47 U/L — ABNORMAL HIGH (ref 15–41)
Albumin: 3.3 g/dL — ABNORMAL LOW (ref 3.5–5.0)
Alkaline Phosphatase: 162 U/L — ABNORMAL HIGH (ref 38–126)
Anion gap: 14 (ref 5–15)
BUN: 56 mg/dL — ABNORMAL HIGH (ref 8–23)
CO2: 20 mmol/L — ABNORMAL LOW (ref 22–32)
Calcium: 9.6 mg/dL (ref 8.9–10.3)
Chloride: 106 mmol/L (ref 98–111)
Creatinine, Ser: 3.47 mg/dL — ABNORMAL HIGH (ref 0.44–1.00)
GFR, Estimated: 14 mL/min — ABNORMAL LOW (ref >60)
Glucose, Bld: 96 mg/dL (ref 70–99)
Potassium: 3.5 mmol/L (ref 3.5–5.1)
Sodium: 140 mmol/L (ref 135–145)
Total Bilirubin: 0.6 mg/dL (ref 0.0–1.2)
Total Protein: 7.8 g/dL (ref 6.5–8.1)

## 2023-09-17 LAB — CULTURE, BLOOD (ROUTINE X 2): Special Requests: ADEQUATE

## 2023-09-17 MED ORDER — OMEGA-3-ACID ETHYL ESTERS 1 G PO CAPS
1.0000 g | ORAL_CAPSULE | Freq: Every day | ORAL | 0 refills | Status: AC
  Filled 2023-09-17: qty 30, 30d supply, fill #0

## 2023-09-17 MED ORDER — CIPROFLOXACIN HCL 500 MG PO TABS
500.0000 mg | ORAL_TABLET | Freq: Every day | ORAL | 0 refills | Status: AC
  Filled 2023-09-17: qty 4, 4d supply, fill #0

## 2023-09-17 MED ORDER — POLYETHYLENE GLYCOL 3350 17 GM/SCOOP PO POWD
17.0000 g | Freq: Every day | ORAL | 0 refills | Status: AC
  Filled 2023-09-17: qty 238, 14d supply, fill #0

## 2023-09-17 MED ORDER — ASPIRIN 81 MG PO TBEC
81.0000 mg | DELAYED_RELEASE_TABLET | Freq: Every day | ORAL | 12 refills | Status: AC
  Filled 2023-09-17: qty 30, 30d supply, fill #0

## 2023-09-17 MED ORDER — SACCHAROMYCES BOULARDII 250 MG PO CAPS
250.0000 mg | ORAL_CAPSULE | Freq: Two times a day (BID) | ORAL | 0 refills | Status: AC
  Filled 2023-09-17: qty 60, 30d supply, fill #0

## 2023-09-17 MED ORDER — SENNOSIDES-DOCUSATE SODIUM 8.6-50 MG PO TABS
2.0000 | ORAL_TABLET | Freq: Every day | ORAL | 0 refills | Status: AC
  Filled 2023-09-17: qty 60, 30d supply, fill #0

## 2023-09-17 NOTE — Discharge Summary (Signed)
 Physician Discharge Summary   Patient: Sandra Henderson MRN: [id number] DOB: May 21, 1960  Admit date:     09/14/2023  Discharge date: 09/17/23  Discharge Physician: Lorita Rosa   PCP: Karalee Oscar, PA   Recommendations at discharge:   Your kidneys are recovering but need to be closely monitored on an ongoing basis Do not take NSAIDs (Motrin, Ibuprofen, Aleve, Naproxen, etc) Continue to hold metformin , losartan, and hydrochlorothiazide  Follow up with PA [place] on Monday as scheduled for hospital f/u and recheck CMP Avoid alcohol  Complete antibiotics (Cipro 500 mg daily starting 5/9 through 5/13) Change aspirin dose to 81 mg daily Monitor LFTs as an outpatient; Crestor is currently continued but may need to be held  Discharge Diagnoses: Principal Problem:   AKI (acute kidney injury) (HCC) Active Problems:   Left lower quadrant abdominal pain   Constipation   Acute renal failure (ARF) [facility])    Hospital Course: 62yo with h/o HTN, HLD, RA, and depression/anxiety who presented on 5/5 with abdominal pain.  She was found to have BUN 50/Creatinine 4.5 (12/0.73 on 05/16/2021), unremarkable CT A/P.  She was admitted for evaluation of AKI.  Assessment and Plan:  Acute renal failure Presented after 5 days of decreased p.o. intake in the setting of abd pain, N/V and constipation Significant elevation in BUN/creatinine to 50/4.50 from 14/0.73 on 06/09/2023 from PCP office CT A/P shows small nonobstructive renal calculi but no hydronephrosis or ureterolithiasis This is likely acute kidney injury in the setting of dehydration + ATN (NSAIDs + metformin  + hydrochlorothiazide  + ARB) Status post IV NS 1 L bolus x 2 in the ED Continued IV hydration with IV NS at 100 cc/h Reports that she is urinating very well, wants to stop IVF Avoid nephrotoxic agents Renal function was slow to respond but is improving Nephrology consultation is appreciated DC to home 5/8 given ongoing improvement Will  need close outpatient f/u   E coli bacteremia Could be contaminant but given fever and symptoms of illness PTA it is reasonable to treat Started on Ceftriaxone Will await sensitivities and taper accordingly Will repeat blood cultures now   Constipation/Hx of diverticulosis Patient with documented history of IBS presented with 5 days of constipation CT A/P negative for diverticulitis or significant stool burden Started MiraLAX daily and Senokot-S at bedtime, escalate as needed for BM   Elevated LFTs CMP shows alk phos 168, AST/ALT 53/60 but normal bilirubin Pt w/ hx of cholecystectomy and CT A/P with no acute pathology in the hepatobiliary tract Stable to slowly improving but not yet at baseline, will need outpatient f/u Avoid alcohol     HTN BP stable with BP 126/82-138/80 Continue amlodipine  Hold olmesartan  and hydrochlorothiazide  in the setting of AKI   HLD Continue Lovaza Continue rosuvastatin for now but closely monitor LFTs   Anxiety and depression Continue bupropion and as needed Xanax    GERD Continue PPI   Class 2 obesity Body mass index is 38.78 kg/m.Aaron Aas  Weight loss should be encouraged Outpatient PCP/bariatric medicine f/u encouraged Significantly low or high BMI is associated with higher medical risk including morbidity and mortality        Consultants: Nephrology TOC team   Procedures: None   Antibiotics: Ceftriaxone 5/7-8 Cipro 5/9-13  Pain control - [place]  Controlled Substance Reporting System database was reviewed. and patient was instructed, not to drive, operate heavy machinery, perform activities at heights, swimming or participation in water activities or provide baby-sitting services while on Pain, Sleep and Anxiety Medications; until their  outpatient Physician has advised to do so again. Also recommended to not to take more than prescribed Pain, Sleep and Anxiety Medications.   Disposition: Home Diet recommendation:  Regular  diet DISCHARGE MEDICATION: Allergies as of 09/17/2023   No Known Allergies      Medication List     PAUSE taking these medications    hydrochlorothiazide  12.5 MG tablet Wait to take this until your doctor or other care provider tells you to start again. Commonly known as: HYDRODIURIL  Take 1 tablet (12.5 mg total) by mouth daily.   metFORMIN  500 MG 24 hr tablet Wait to take this until your doctor or other care provider tells you to start again. Commonly known as: GLUCOPHAGE -XR Take 500 mg by mouth in the morning.   olmesartan  40 MG tablet Wait to take this until your doctor or other care provider tells you to start again. Commonly known as: BENICAR  Take 40 mg by mouth at bedtime.       STOP taking these medications    ibuprofen 200 MG tablet Commonly known as: ADVIL   NITROFURANTOIN MACROCRYSTAL PO       TAKE these medications    acetaminophen  325 MG tablet Commonly known as: TYLENOL  Take 325 mg by mouth every 6 (six) hours as needed for moderate pain or headache.   ALPRAZolam  1 MG tablet Commonly known as: XANAX  Take 0.5-1 mg by mouth daily as needed for anxiety or sleep.   amLODipine  10 MG tablet Commonly known as: NORVASC  Take 10 mg by mouth at bedtime.   aspirin EC 81 MG tablet Take 1 tablet (81 mg total) by mouth daily. Swallow whole. Start taking on: Sep 18, 2023 What changed:  how much to take when to take this additional instructions   AZO-CRANBERRY PO Take 2 tablets by mouth as needed.   buPROPion 150 MG 12 hr tablet Commonly known as: WELLBUTRIN SR Take 150 mg by mouth every morning.   CENTRUM SILVER 50+WOMEN PO Take 1 tablet by mouth in the morning.   ciprofloxacin 500 MG tablet Commonly known as: Cipro Take 1 tablet (500 mg total) by mouth daily for 4 days. Start taking on: Sep 18, 2023   Fish Oil 1200 MG Caps Take 1 capsule by mouth in the morning.   hydrocortisone 2.5 % cream Apply 1 Application topically as needed.   Nyamyc  powder Generic drug: nystatin Apply 1 application  topically daily.   omega-3 acid ethyl esters 1 g capsule Commonly known as: LOVAZA Take 1 capsule (1 g total) by mouth daily. Start taking on: Sep 18, 2023   omeprazole 20 MG capsule Commonly known as: PRILOSEC Take 20 mg by mouth in the morning.   polyethylene glycol 17 g packet Commonly known as: MIRALAX / GLYCOLAX Take 17 g by mouth daily. Start taking on: Sep 18, 2023   POTASSIUM GLUCONATE PO Take 1 tablet by mouth as needed.   QC Vitamin D3 50 MCG (2000 UT) Tabs Generic drug: Cholecalciferol Take 2,000 Units by mouth in the morning.   rosuvastatin 40 MG tablet Commonly known as: CRESTOR Take 40 mg by mouth at bedtime.   saccharomyces boulardii 250 MG capsule Commonly known as: FLORASTOR Take 1 capsule (250 mg total) by mouth 2 (two) times daily.   senna-docusate 8.6-50 MG tablet Commonly known as: Senokot-S Take 2 tablets by mouth at bedtime.   sodium chloride  0.65 % Soln nasal spray Commonly known as: OCEAN Place 1 spray into both nostrils as needed for congestion.  SYSTANE OP Place 1-2 drops into both eyes daily. Instill 1-2 drops into both eyes once daily. May use 1-2 drops at night if needed.        Discharge Exam:   Subjective: Feeling well, excited to go home, feels comfortable with current plan, has appointment on Monday with PCP.   Objective: Vitals:   09/16/23 2025 09/17/23 0550  BP: 132/78 126/82  Pulse: 83 73  Resp: 18 16  Temp: (!) 97.4 F (36.3 C) 97.8 F (36.6 C)  SpO2: 97% 98%   No intake or output data in the 24 hours ending 09/17/23 1242  Filed Weights   09/14/23 0908 09/16/23 0500 09/17/23 0500  Weight: 99.3 kg 102 kg 101.8 kg    Exam:  General:  Appears calm and comfortable and is in NAD, dressed and up in bedside chair Eyes:  EOMI, normal lids, iris ENT:  grossly normal hearing, lips & tongue, mmm Neck:  no LAD, masses or thyromegaly Cardiovascular:  RRR. No LE  edema.  Respiratory:   CTA bilaterally with no wheezes/rales/rhonchi.  Normal respiratory effort. Abdomen:  soft, NT, ND Skin:  no rash or induration seen on limited exam Musculoskeletal:  grossly normal tone BUE/BLE, good ROM, no bony abnormality Psychiatric:  grossly normal mood and affect, speech fluent and appropriate, AOx3 Neurologic:  CN 2-12 grossly intact, moves all extremities in coordinated fashion  Data Reviewed: I have reviewed the patient's lab results since admission.  Pertinent labs for today include:   CO 20, improved BUN 56/Creatinine 3.47/GFR 14 AP 162 Albumin 3.3 AST 47/ALT 79 Normal CBC    Condition at discharge: improving  The results of significant diagnostics from this hospitalization (including imaging, microbiology, ancillary and laboratory) are listed below for reference.   Imaging Studies: CT ABDOMEN PELVIS WO CONTRAST Result Date: 09/14/2023 CLINICAL DATA:  LLQ abdominal pain. EXAM: CT ABDOMEN AND PELVIS WITHOUT CONTRAST TECHNIQUE: Multidetector CT imaging of the abdomen and pelvis was performed following the standard protocol without IV contrast. RADIATION DOSE REDUCTION: This exam was performed according to the departmental dose-optimization program which includes automated exposure control, adjustment of the mA and/or kV according to patient size and/or use of iterative reconstruction technique. COMPARISON:  None Available. FINDINGS: Lower chest: There are patchy atelectatic changes at the left lung base. Bilateral lungs are otherwise clear. No overt consolidation. No pleural effusion. The heart is normal in size. No pericardial effusion. Hepatobiliary: The liver is normal in size. Non-cirrhotic configuration. No suspicious mass. There is a subcapsular 7 x 10 mm focus in the right hepatic lobe, segment 5, which is too small to adequately characterize. No intrahepatic or extrahepatic bile duct dilation. Gallbladder is surgically absent. Pancreas: Unremarkable.  No pancreatic ductal dilatation or surrounding inflammatory changes. Spleen: Within normal limits. No focal lesion. Adrenals/Urinary Tract: Adrenal glands are unremarkable. No suspicious renal mass within the limitations of this unenhanced exam. There is a 4 mm nonobstructing calculus in the left kidney upper pole and a 3 mm nonobstructing calculus in the right kidney lower pole. No ureterolithiasis or obstructive uropathy on either side. Urinary bladder is under distended, precluding optimal assessment. However, no large mass or stones identified. No perivesical fat stranding. Stomach/Bowel: No disproportionate dilation of the small or large bowel loops. No evidence of abnormal bowel wall thickening or inflammatory changes. The appendix is unremarkable. There are multiple diverticula throughout the colon, without imaging signs of diverticulitis. Vascular/Lymphatic: No ascites or pneumoperitoneum. No abdominal or pelvic lymphadenopathy, by size criteria. No aneurysmal dilation  of the major abdominal arteries. There are mild peripheral atherosclerotic vascular calcifications of the aorta and its major branches. Reproductive: The uterus is unremarkable. No large adnexal mass. Other: There is a tiny fat containing umbilical hernia. There is a small right paramedian fat containing epigastric ventral hernia with narrow neck. No herniation of bowel loops. The soft tissues and abdominal wall are otherwise unremarkable. Musculoskeletal: No suspicious osseous lesions. There are mild multilevel degenerative changes in the visualized spine. IMPRESSION: 1. No acute inflammatory process identified within the abdomen or pelvis. 2. There are bilateral nonobstructing renal calculi, as described above. No ureterolithiasis or obstructive uropathy on either side. 3. Multiple other nonacute observations, as described above. Aortic Atherosclerosis (ICD10-I70.0). Electronically Signed   By: Beula Brunswick M.D.   On: 09/14/2023 13:42     Microbiology: Results for orders placed or performed during the hospital encounter of 09/14/23  Blood culture (routine x 2)     Status: None (Preliminary result)   Collection Time: 09/14/23  2:50 PM   Specimen: BLOOD  Result Value Ref Range Status   Specimen Description   Final    BLOOD RIGHT ANTECUBITAL Performed at [facility], [address] [place], [place] [address]    Special Requests   Final    BOTTLES DRAWN AEROBIC ONLY Blood Culture adequate volume Performed at [facility], [address] [place], [place] [address]    Culture   Final    NO GROWTH 3 DAYS Performed at [facility], [address], [place], [place] [address]    Report Status PENDING  Incomplete  Blood culture (routine x 2)     Status: Abnormal   Collection Time: 09/14/23  2:59 PM   Specimen: BLOOD LEFT FOREARM  Result Value Ref Range Status   Specimen Description   Final    BLOOD LEFT FOREARM Performed at [facility], [address] [place], [place] [address]    Special Requests   Final    BOTTLES DRAWN AEROBIC AND ANAEROBIC Blood Culture adequate volume Performed at [facility], [address] [place], [place] [address]    Culture  Setup Time   Final    GRAM NEGATIVE RODS ANAEROBIC BOTTLE ONLY CRITICAL RESULT CALLED TO, READ BACK BY AND VERIFIED WITH: PHARMD C WINSOR [id number] AT 958 AM BY CM Performed at [facility], [address] [place], [place] [address]    Culture ESCHERICHIA COLI (A)  Final   Report Status 09/17/2023 FINAL  Final   Organism ID, Bacteria ESCHERICHIA COLI  Final   Organism ID, Bacteria ESCHERICHIA COLI  Final      Susceptibility   Escherichia coli - KIRBY BAUER*    CEFAZOLIN INTERMEDIATE Intermediate    Escherichia coli - MIC*    AMPICILLIN >=32 RESISTANT Resistant     CEFEPIME <=0.12 SENSITIVE Sensitive     CEFTAZIDIME <=1 SENSITIVE Sensitive     CEFTRIAXONE <=0.25 SENSITIVE Sensitive     CIPROFLOXACIN <=0.25  SENSITIVE Sensitive     GENTAMICIN <=1 SENSITIVE Sensitive     IMIPENEM <=0.25 SENSITIVE Sensitive     TRIMETH/SULFA <=20 SENSITIVE Sensitive     AMPICILLIN/SULBACTAM 16 INTERMEDIATE Intermediate     PIP/TAZO <=4 SENSITIVE Sensitive ug/mL    * ESCHERICHIA COLI    ESCHERICHIA COLI  Blood Culture ID Panel (Reflexed)     Status: Abnormal   Collection Time: 09/14/23  2:59 PM  Result Value Ref Range  Status   Enterococcus faecalis NOT DETECTED NOT DETECTED Final   Enterococcus Faecium NOT DETECTED NOT DETECTED Final   Listeria monocytogenes NOT DETECTED NOT DETECTED Final   Staphylococcus species NOT DETECTED NOT DETECTED Final   Staphylococcus aureus (BCID) NOT DETECTED NOT DETECTED Final   Staphylococcus epidermidis NOT DETECTED NOT DETECTED Final   Staphylococcus lugdunensis NOT DETECTED NOT DETECTED Final   Streptococcus species NOT DETECTED NOT DETECTED Final   Streptococcus agalactiae NOT DETECTED NOT DETECTED Final   Streptococcus pneumoniae NOT DETECTED NOT DETECTED Final   Streptococcus pyogenes NOT DETECTED NOT DETECTED Final   A.calcoaceticus-baumannii NOT DETECTED NOT DETECTED Final   Bacteroides fragilis NOT DETECTED NOT DETECTED Final   Enterobacterales DETECTED (A) NOT DETECTED Final    Comment: Enterobacterales represent a large order of gram negative bacteria, not a single organism. CRITICAL RESULT CALLED TO, READ BACK BY AND VERIFIED WITH: PHARMD C WINSOR [id number] AT 958 AM BY CM    Enterobacter cloacae complex NOT DETECTED NOT DETECTED Final   Escherichia coli DETECTED (A) NOT DETECTED Final    Comment: CRITICAL RESULT CALLED TO, READ BACK BY AND VERIFIED WITH: PHARMD C WINSOR [id number] AT 958 AM BY CM    Klebsiella aerogenes NOT DETECTED NOT DETECTED Final   Klebsiella oxytoca NOT DETECTED NOT DETECTED Final   Klebsiella pneumoniae NOT DETECTED NOT DETECTED Final   Proteus species NOT DETECTED NOT DETECTED Final   Salmonella species NOT DETECTED NOT DETECTED Final    Serratia marcescens NOT DETECTED NOT DETECTED Final   Haemophilus influenzae NOT DETECTED NOT DETECTED Final   Neisseria meningitidis NOT DETECTED NOT DETECTED Final   Pseudomonas aeruginosa NOT DETECTED NOT DETECTED Final   Stenotrophomonas maltophilia NOT DETECTED NOT DETECTED Final   Candida albicans NOT DETECTED NOT DETECTED Final   Candida auris NOT DETECTED NOT DETECTED Final   Candida glabrata NOT DETECTED NOT DETECTED Final   Candida krusei NOT DETECTED NOT DETECTED Final   Candida parapsilosis NOT DETECTED NOT DETECTED Final   Candida tropicalis NOT DETECTED NOT DETECTED Final   Cryptococcus neoformans/gattii NOT DETECTED NOT DETECTED Final   CTX-M ESBL NOT DETECTED NOT DETECTED Final   Carbapenem resistance IMP NOT DETECTED NOT DETECTED Final   Carbapenem resistance KPC NOT DETECTED NOT DETECTED Final   Carbapenem resistance NDM NOT DETECTED NOT DETECTED Final   Carbapenem resist OXA 48 LIKE NOT DETECTED NOT DETECTED Final   Carbapenem resistance VIM NOT DETECTED NOT DETECTED Final    Comment: Performed at [facility], [address], [place], [place] [address]  Culture, blood (Routine X 2) w Reflex to ID Panel     Status: None (Preliminary result)   Collection Time: 09/16/23  4:50 PM   Specimen: BLOOD RIGHT ARM  Result Value Ref Range Status   Specimen Description   Final    BLOOD RIGHT ARM Performed at [facility], [address], [place], [place] [address]    Special Requests   Final    BOTTLES DRAWN AEROBIC AND ANAEROBIC Blood Culture adequate volume Performed at [facility], [address], [place], [place] [address]    Culture   Final    NO GROWTH < 24 HOURS Performed at [facility], [address], [place], [place] [address]    Report Status PENDING  Incomplete  Culture, blood (Routine X 2) w Reflex to ID Panel     Status: None (Preliminary result)   Collection Time: 09/16/23  5:00 PM  Specimen: BLOOD RIGHT ARM  Result  Value Ref Range Status   Specimen Description   Final    BLOOD RIGHT ARM Performed at [facility], [address], [place], [place] [address]    Special Requests   Final    BOTTLES DRAWN AEROBIC AND ANAEROBIC Blood Culture adequate volume Performed at [facility], [address] [place], [place] [address]    Culture   Final    NO GROWTH < 24 HOURS Performed at [facility], [address] [place], [place] [address]    Report Status PENDING  Incomplete    Labs: CBC: Recent Labs  Lab 09/14/23 0944 09/15/23 0612 09/16/23 0524 09/17/23 0542  WBC 12.2* 9.9 8.8 8.3  NEUTROABS 10.0*  --   --  5.8  HGB 13.0 11.5* 11.7* 13.5  HCT 36.9 34.6* 36.1 40.4  MCV 91.1 94.5 96.8 84.9  PLT 339 328 379 291   Basic Metabolic Panel: Recent Labs  Lab 09/14/23 0944 09/14/23 1309 09/14/23 1510 09/15/23 0612 09/16/23 0524 09/17/23 0707  NA 134*  --   --  138 135 140  K 3.6  --   --  3.6 3.5 3.5  CL 97*  --   --  107 105 106  CO2 18*  --   --  21* 18* 20*  GLUCOSE 113*  --   --  102* 109* 96  BUN 50*  --   --  57* 57* 56*  CREATININE 4.50* 4.77*  --  4.70* 4.22* 3.47*  CALCIUM 9.9  --   --  8.8* 9.0 9.6  MG  --   --  2.4  --   --   --   PHOS  --   --  5.0* 4.1 4.5  --    Liver Function Tests: Recent Labs  Lab 09/14/23 0944 09/15/23 0612 09/16/23 0524 09/17/23 0707  AST 53*  --   --  47*  ALT 60*  --   --  79*  ALKPHOS 168*  --   --  162*  BILITOT 0.7  --   --  0.6  PROT 7.8  --   --  7.8  ALBUMIN 3.9 2.8* 3.0* 3.3*   CBG: No results for input(s): "GLUCAP" in the last 168 hours.  Discharge time spent: greater than 30 minutes.  Signed: Lorita Rosa, MD [facility] 09/17/2023

## 2023-09-17 NOTE — Plan of Care (Signed)

## 2023-09-17 NOTE — Progress Notes (Signed)
 AVS reviewed w/ pt who verbalized an understanding. TOC meds in a secure bag delivered to pt in room by this RN. PIV removed as noted . Pt dressed fo rd/c - no other questions. Pt to lobby via w/c- home w/ spouse

## 2023-09-19 LAB — CULTURE, BLOOD (ROUTINE X 2)
Culture: NO GROWTH
Special Requests: ADEQUATE

## 2023-09-21 LAB — CULTURE, BLOOD (ROUTINE X 2)
Culture: NO GROWTH
Culture: NO GROWTH
Special Requests: ADEQUATE
Special Requests: ADEQUATE

## 2023-10-13 ENCOUNTER — Other Ambulatory Visit (HOSPITAL_COMMUNITY): Payer: Self-pay

## 2024-02-04 IMAGING — MR MR HEAD W/O CM
11 series · 48 of 48 positions shown · non-contrast
Comparison: 09/27/2014

CLINICAL DATA: Anisocoria.  Blurry vision since early [REDACTED].

Pupil dilation QXM.MG (OO1-5T-CM)Vision changes 17F.S (OO1-5T-CM)
EXAM:
MRI HEAD WITHOUT CONTRAST
MRA HEAD WITHOUT CONTRAST
TECHNIQUE: Multiplanar, multi-echo pulse sequences of the brain and surrounding
structures were acquired without intravenous contrast. Angiographic
images of the Circle of Willis were acquired using MRA technique
without intravenous contrast.

[Series 5: T1 · sagittal · 4.0mm · 0.75mm/px · 3 of 31 slices shown (1 of 2)]
[im 1/31]
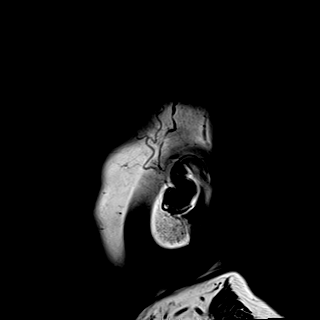
[im 16/31]
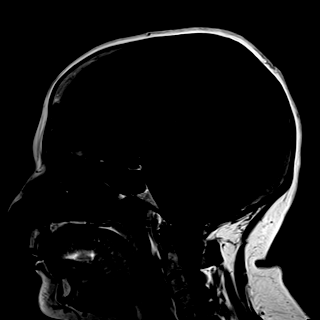
[im 31/31]
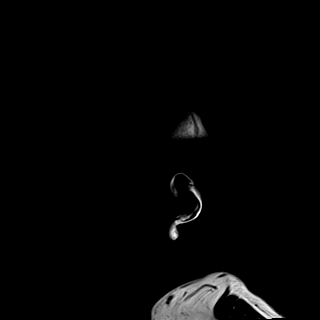

[Series 6: DWI · axial · 3.0mm · 0.94mm/px · z∈[-74,+66]mm · 10 of 160 slices shown (1 of 3)]
[im 1/160]
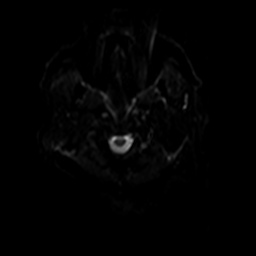
[im 18/160]
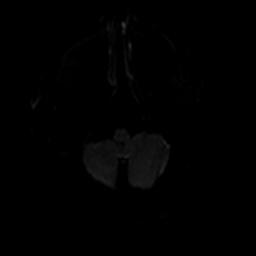
[im 36/160]
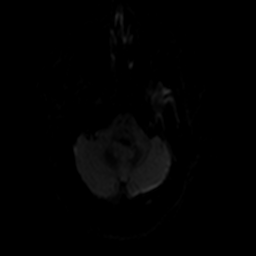
[im 54/160]
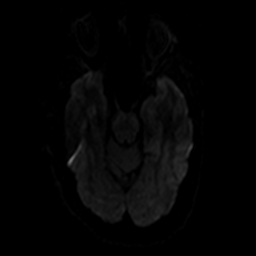
[im 71/160]
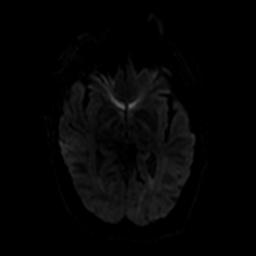
[im 89/160]
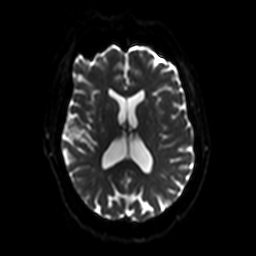
[im 107/160]
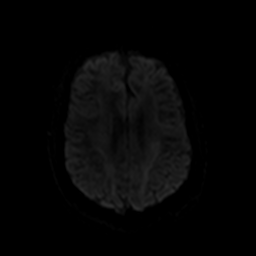
[im 124/160]
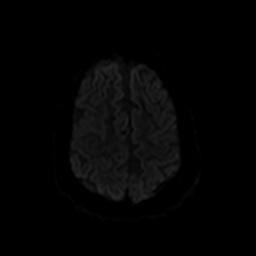
[im 142/160]
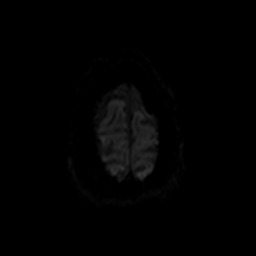
[im 160/160]
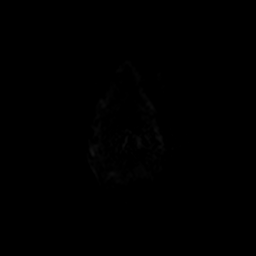

[Series 7: ax dwi_tracew · axial · 3.0mm · 0.94mm/px · z∈[-74,+66]mm · 5 of 80 slices shown]
[im 1/80]
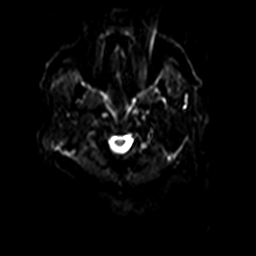
[im 20/80]
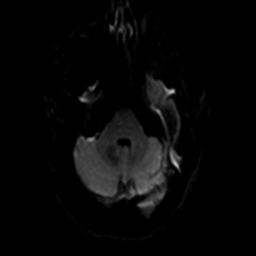
[im 40/80]
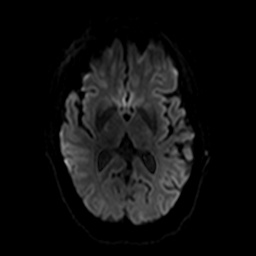
[im 60/80]
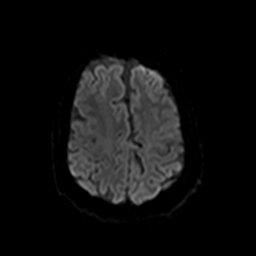
[im 80/80]
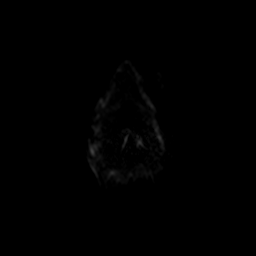

[Series 8: ax dwi_adc · axial · 3.0mm · 0.94mm/px · z∈[-74,+66]mm · 2 of 38 slices shown]
[im 1/38]
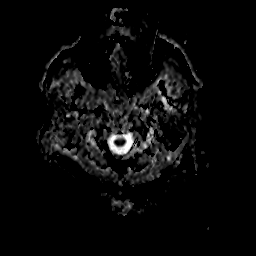
[im 38/38]
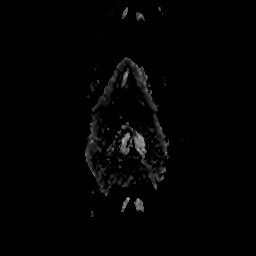

[Series 9: DWI · coronal · 5.0mm · 1.44mm/px · 4 of 64 slices shown (2 of 3)]
[im 1/64]
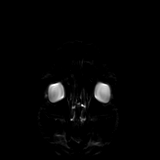
[im 22/64]
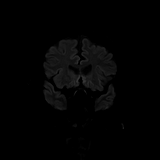
[im 43/64]
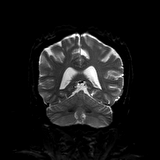
[im 64/64]
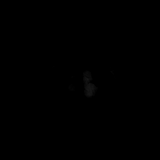

[Series 10: DWI · coronal · 5.0mm · 1.44mm/px · 2 of 32 slices shown (3 of 3)]
[im 1/32]
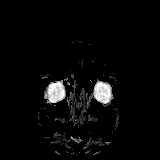
[im 32/32]
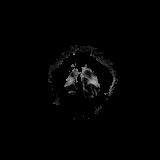

[Series 11: T2 · axial · 4.0mm · 0.36mm/px · z∈[-66,+85]mm · 2 of 30 slices shown (1 of 2)]
[im 1/30]
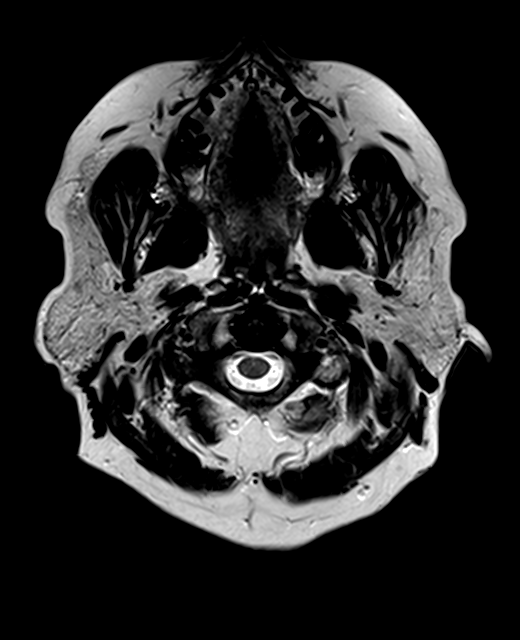
[im 30/30]
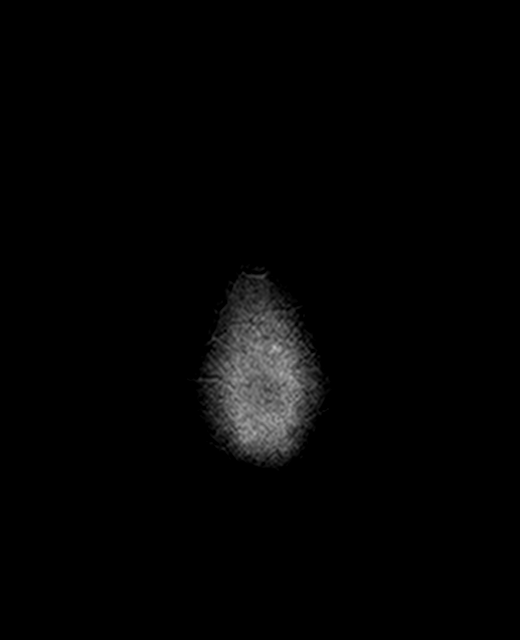

[Series 12: FLAIR · axial · 3.0mm · 0.72mm/px · z∈[-66,+84]mm · 2 of 26 slices shown]
[im 1/26]
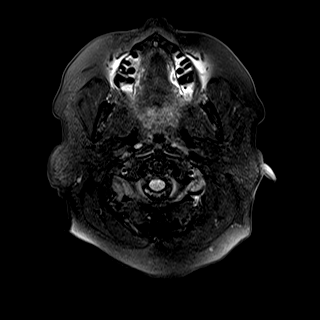
[im 26/26]
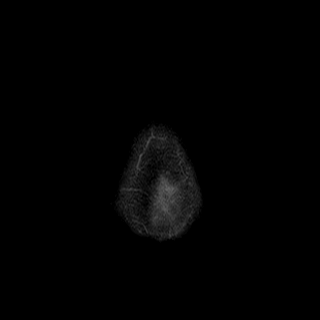

[Series 13: swi_images · axial · 1.5mm · 0.90mm/px · z∈[-61,+81]mm · 6 of 96 slices shown]
[im 1/96]
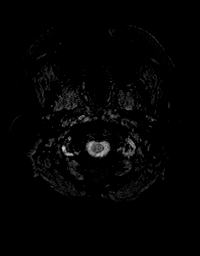
[im 20/96]
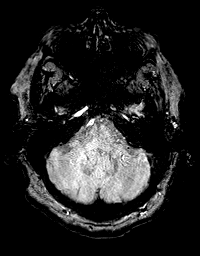
[im 39/96]
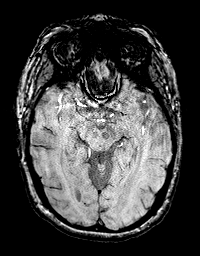
[im 58/96]
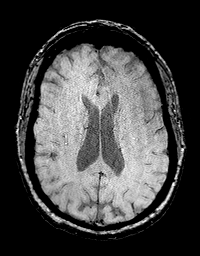
[im 77/96]
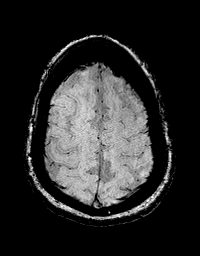
[im 96/96]
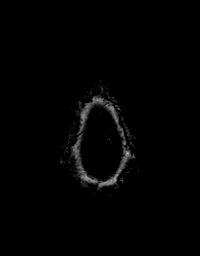

[Series 15: T1 · axial · 1.0mm · 0.94mm/px · z∈[-79,+79]mm · 10 of 160 slices shown (2 of 2)]
[im 1/160]
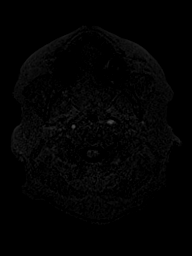
[im 18/160]
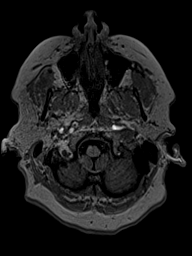
[im 36/160]
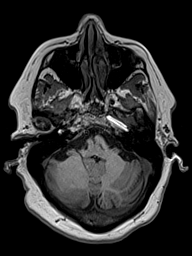
[im 54/160]
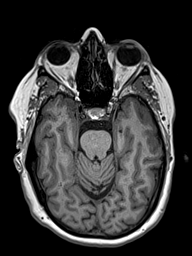
[im 71/160]
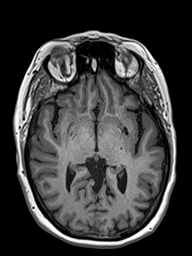
[im 89/160]
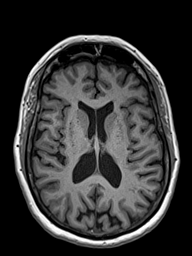
[im 107/160]
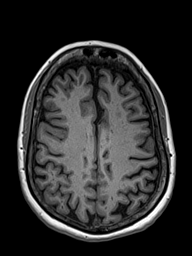
[im 124/160]
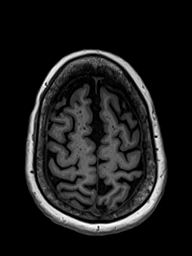
[im 142/160]
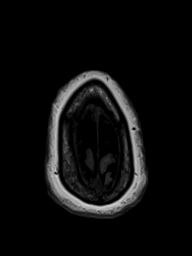
[im 160/160]
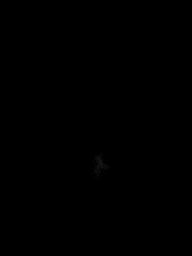

[Series 16: T2 · coronal · 4.5mm · 0.36mm/px · 2 of 30 slices shown (2 of 2)]
[im 1/30]
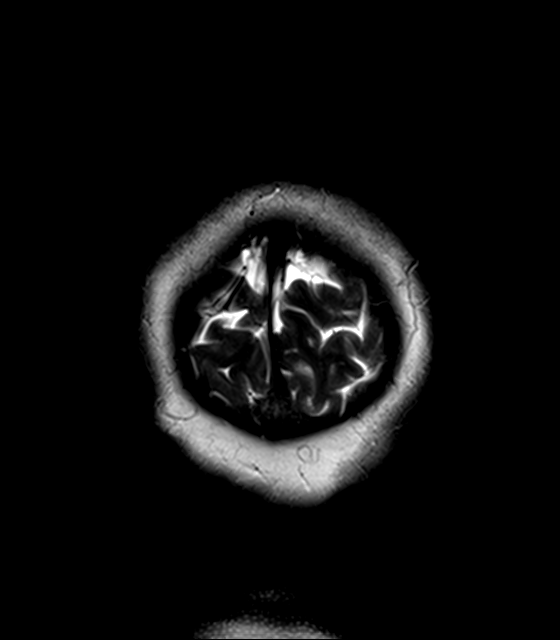
[im 30/30]
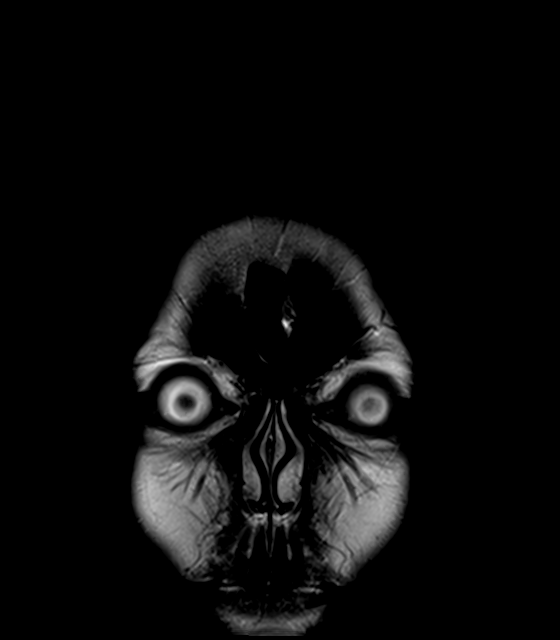

[48 of 48 positions shown; findings below may reference images not displayed]

FINDINGS: MRI HEAD FINDINGS

Brain: No acute infarct, mass effect or extra-axial collection. No
acute or chronic hemorrhage. Minimal multifocal hyperintense
T2-weight signal within the white matter. Mild volume loss. Dilated
perivascular space at the left lentiform nucleus. The midline
structures are normal.

Vascular: Major flow voids are preserved.

Skull and upper cervical spine: Normal calvarium and skull base.
Visualized upper cervical spine and soft tissues are normal.

Sinuses/Orbits:No paranasal sinus fluid levels or advanced mucosal
thickening. No mastoid or middle ear effusion. Normal orbits.

MRA HEAD FINDINGS

POSTERIOR CIRCULATION:

--Vertebral arteries: Normal

--Inferior cerebellar arteries: Normal.

--Basilar artery: Normal.

--Superior cerebellar arteries: Normal.

--Posterior cerebral arteries: Normal.

ANTERIOR CIRCULATION:

--Intracranial internal carotid arteries: Normal.

--Anterior cerebral arteries (ACA): Normal.

--Middle cerebral arteries (MCA): Normal.

ANATOMIC VARIANTS: Fetal origin of the right PCA.
IMPRESSION: 1. No acute intracranial abnormality.
2. Normal intracranial MRA.
3. Mild volume loss and chronic small vessel disease.

## 2024-02-29 DIAGNOSIS — M76821 Posterior tibial tendinitis, right leg: Secondary | ICD-10-CM | POA: Diagnosis not present

## 2024-02-29 DIAGNOSIS — M1711 Unilateral primary osteoarthritis, right knee: Secondary | ICD-10-CM | POA: Diagnosis not present

## 2024-02-29 DIAGNOSIS — S83271D Complex tear of lateral meniscus, current injury, right knee, subsequent encounter: Secondary | ICD-10-CM | POA: Diagnosis not present
